# Patient Record
Sex: Male | Born: 1995 | Race: White | Hispanic: No | Marital: Single | Smoking: Never smoker
Health system: Southern US, Community
[De-identification: ages and names within clinical notes are randomized; demographics above are authoritative.]

## PROBLEM LIST (undated history)

## (undated) DIAGNOSIS — F319 Bipolar disorder, unspecified: Secondary | ICD-10-CM

## (undated) DIAGNOSIS — E669 Obesity, unspecified: Secondary | ICD-10-CM

## (undated) DIAGNOSIS — K219 Gastro-esophageal reflux disease without esophagitis: Secondary | ICD-10-CM

## (undated) DIAGNOSIS — F329 Major depressive disorder, single episode, unspecified: Secondary | ICD-10-CM

## (undated) DIAGNOSIS — H539 Unspecified visual disturbance: Secondary | ICD-10-CM

## (undated) DIAGNOSIS — F32A Depression, unspecified: Secondary | ICD-10-CM

## (undated) DIAGNOSIS — K449 Diaphragmatic hernia without obstruction or gangrene: Secondary | ICD-10-CM

## (undated) DIAGNOSIS — H9190 Unspecified hearing loss, unspecified ear: Secondary | ICD-10-CM

## (undated) DIAGNOSIS — F419 Anxiety disorder, unspecified: Secondary | ICD-10-CM

## (undated) DIAGNOSIS — I10 Essential (primary) hypertension: Secondary | ICD-10-CM

## (undated) HISTORY — PX: ADENOIDECTOMY: SUR15

## (undated) HISTORY — PX: TYMPANOSTOMY TUBE PLACEMENT: SHX32

---
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
## 2015-01-02 ENCOUNTER — Other Ambulatory Visit: Payer: Self-pay | Admitting: Family Medicine

## 2015-01-24 ENCOUNTER — Encounter: Payer: Self-pay | Admitting: Family Medicine

## 2015-01-24 ENCOUNTER — Ambulatory Visit (INDEPENDENT_AMBULATORY_CARE_PROVIDER_SITE_OTHER): Payer: Medicaid Other | Admitting: Family Medicine

## 2015-01-24 VITALS — BP 140/86 | HR 88 | Temp 97.8°F | Ht 67.0 in | Wt 393.8 lb

## 2015-01-24 DIAGNOSIS — A084 Viral intestinal infection, unspecified: Secondary | ICD-10-CM

## 2015-01-24 MED ORDER — ONDANSETRON 4 MG PO TBDP: 4.0000 mg | ORAL_TABLET | Freq: Three times a day (TID) | ORAL | Status: DC | PRN

## 2015-01-24 NOTE — Progress Notes (Signed)
   Subjective:    Patient ID: Maxwell Rose, male    DOB: 05-03-1996, 19 y.o.   MRN: [id number]  HPI: Pt presents to clinic for SDA, brought in with father, for 3 days of vomiting and diarrhea. His symptoms started with some reflux-type pain (sharp, in the middle of his upper abdomen / lower chest), which progressed into diarrhea and then nausea / vomiting over the next day. Today and yesterday his vomiting has been improved but he still has some loose stools, with a greenish color. He has no blood in his stool or emesis. No one else at home has been ill. He has no new exposures or medications. He takes omeprazole for reflux; he is also on Toprol and HCTZ for blood pressure. He has been able to keep his fluid and food intake up.  Review of Systems: As above. He had fever (tactile) two days ago and yesterday. Otherwise he has no SOB or difficulty breathing.     Objective:   Physical Exam BP 140/86 mmHg  Pulse 88  Temp(Src) 97.8 F (36.6 C) (Oral)  Ht 5\' 7"  (1.702 m)  Wt 393 lb 12.8 oz (178.627 kg)  BMI 61.66 kg/m2 Gen: morbidly obese young adult male in NAD HEENT: [place]/AT, EOMI, PERRLA, MMM, posterior oropharynx clear Cardio: heart sounds distant due to body habitus but no frank murmur noted Pulm: breath sounds also distant due to body habitus but generally clear, WOB unlabored Abd: morbidly obese greatly limiting exam, but generally soft, BS+  Mild tenderness to very deep palpation in epigastrium Ext: warm, well-perfused     Assessment & Plan:  19yo male with history suggestive of viral gastroenteritis, +/- component of exacerbated GERD-type symptoms - general well appearance with reported improvement in symptoms without treatment, so far - Rx for Zofran for symptomatic treatment of nausea - advised OTC medications for loose stools (Imodium; father mentioned milk of magnesia or Pepto-Bismol) - counseled on expected natural course of viral gastroenteritis and advised close f/u if  symptoms persist, worsen, or if new symptoms develop - otherwise, f/u with PCP Dr. Leveda AnnaHensel as needed.  Of note, CMA noted that father stated pt needed f/u to "review his medications," and that today "was supposed to be a follow-up, but then he got sick" - advised to f/u with PCP for long-term medication review, as needed  Bobbye Mortonhristopher M Koral Thaden, MD PGY-3, [facility] 01/24/2015, 11:06 AM

## 2015-01-24 NOTE — Progress Notes (Signed)
I was the preceptor for this visit. 

## 2015-01-24 NOTE — Patient Instructions (Signed)
Thank you for coming in, today!  I think you most likely have a viral gastroenteritis (stomach flu) causing your symptoms. You can take Zofran (ondansetron, prescription nausea medicine) as needed for nausea or vomiting. For diarrhea, you can try over-the-counter medications.  I would recommend Imodium (loperamide). You can also try milk of magnesia or Pepto-Bismol, etc. I would expect your symptoms to get better over the next 1-3 days, since it has already been 4. Come back to see Dr. Leveda AnnaHensel as you need, otherwise.  Please feel free to call with any questions or concerns at any time, at [phone]. --Dr. Casper HarrisonStreet

## 2015-04-21 ENCOUNTER — Ambulatory Visit (INDEPENDENT_AMBULATORY_CARE_PROVIDER_SITE_OTHER): Payer: Medicaid Other | Admitting: Family Medicine

## 2015-04-21 ENCOUNTER — Encounter: Payer: Self-pay | Admitting: Family Medicine

## 2015-04-21 DIAGNOSIS — I10 Essential (primary) hypertension: Secondary | ICD-10-CM

## 2015-04-21 DIAGNOSIS — F332 Major depressive disorder, recurrent severe without psychotic features: Secondary | ICD-10-CM | POA: Diagnosis not present

## 2015-04-21 DIAGNOSIS — K219 Gastro-esophageal reflux disease without esophagitis: Secondary | ICD-10-CM | POA: Diagnosis not present

## 2015-04-21 MED ORDER — METOPROLOL SUCCINATE ER 50 MG PO TB24: 50.0000 mg | ORAL_TABLET | Freq: Every day | ORAL | Status: DC

## 2015-04-21 MED ORDER — OMEPRAZOLE 40 MG PO CPDR: 40.0000 mg | DELAYED_RELEASE_CAPSULE | Freq: Every day | ORAL | Status: DC

## 2015-04-21 MED ORDER — HYDROCHLOROTHIAZIDE 25 MG PO TABS: 25.0000 mg | ORAL_TABLET | Freq: Every day | ORAL | Status: DC

## 2015-04-21 MED ORDER — FLUOXETINE HCL 40 MG PO CAPS: 40.0000 mg | ORAL_CAPSULE | Freq: Every day | ORAL | Status: DC

## 2015-04-21 NOTE — Patient Instructions (Signed)
I refilled your three medications. I also started a new antidepressant. Keep moving.  That is great I am VERY impressed with your 18 lb wt loss.

## 2015-04-21 NOTE — Progress Notes (Signed)
   Subjective:    Patient ID: Maxwell Rose, male    DOB: 1996/04/11, 19 y.o.   MRN: [id number]  HPI Maxwell Rose looks the best I have seen him in a couple of years.  He has lost 18 lbs since last visit.  He has done so by improving diet and increasing activity.  He is motivated by a new girlfriend.   Depression is moderate.  No HI or SI.  Not taking effexor because "it did not work."  Wants to try something else.     Review of Systems     Objective:   Physical ExamHigh BP noted.  Lungs clear Cardiac RRR without m or g         Assessment & Plan:

## 2015-04-21 NOTE — Assessment & Plan Note (Signed)
Given good wt loss and that he has not been taking HCTZ, no med change for now.  FU one month.

## 2015-04-21 NOTE — Assessment & Plan Note (Signed)
Prozac.  FU one month.

## 2015-04-21 NOTE — Assessment & Plan Note (Signed)
Nice progress.

## 2015-04-21 NOTE — Assessment & Plan Note (Signed)
Refill

## 2015-05-15 ENCOUNTER — Emergency Department (HOSPITAL_COMMUNITY)
Admission: EM | Admit: 2015-05-15 | Discharge: 2015-05-15 | Disposition: A | Discharge status: Home / Self Care | Payer: Medicaid Other | Attending: Emergency Medicine | Admitting: Emergency Medicine

## 2015-05-15 ENCOUNTER — Encounter (HOSPITAL_COMMUNITY): Payer: Self-pay | Admitting: Emergency Medicine

## 2015-05-15 DIAGNOSIS — I10 Essential (primary) hypertension: Secondary | ICD-10-CM | POA: Insufficient documentation

## 2015-05-15 DIAGNOSIS — Z880 Allergy status to penicillin: Secondary | ICD-10-CM | POA: Insufficient documentation

## 2015-05-15 DIAGNOSIS — R821 Myoglobinuria: Secondary | ICD-10-CM | POA: Diagnosis not present

## 2015-05-15 DIAGNOSIS — Z8669 Personal history of other diseases of the nervous system and sense organs: Secondary | ICD-10-CM | POA: Insufficient documentation

## 2015-05-15 DIAGNOSIS — K219 Gastro-esophageal reflux disease without esophagitis: Secondary | ICD-10-CM | POA: Diagnosis not present

## 2015-05-15 DIAGNOSIS — R319 Hematuria, unspecified: Secondary | ICD-10-CM | POA: Diagnosis present

## 2015-05-15 DIAGNOSIS — F329 Major depressive disorder, single episode, unspecified: Secondary | ICD-10-CM | POA: Insufficient documentation

## 2015-05-15 DIAGNOSIS — Z79899 Other long term (current) drug therapy: Secondary | ICD-10-CM | POA: Diagnosis not present

## 2015-05-15 LAB — URINE MICROSCOPIC-ADD ON

## 2015-05-15 LAB — I-STAT CHEM 8, ED
BUN: 19 mg/dL (ref 6–20)
Calcium, Ion: 1.1 mmol/L — ABNORMAL LOW (ref 1.12–1.23)
Chloride: 100 mmol/L — ABNORMAL LOW (ref 101–111)
Creatinine, Ser: 1.3 mg/dL — ABNORMAL HIGH (ref 0.61–1.24)
Glucose, Bld: 82 mg/dL (ref 65–99)
HCT: 45 % (ref 39.0–52.0)
HEMOGLOBIN: 15.3 g/dL (ref 13.0–17.0)
Potassium: 3.1 mmol/L — ABNORMAL LOW (ref 3.5–5.1)
Sodium: 141 mmol/L (ref 135–145)
TCO2: 24 mmol/L (ref 0–100)

## 2015-05-15 LAB — URINALYSIS, ROUTINE W REFLEX MICROSCOPIC
Bilirubin Urine: NEGATIVE
Glucose, UA: NEGATIVE mg/dL
KETONES UR: 15 mg/dL — AB
Leukocytes, UA: NEGATIVE
Nitrite: NEGATIVE
PROTEIN: NEGATIVE mg/dL
SPECIFIC GRAVITY, URINE: 1.025 (ref 1.005–1.030)
UROBILINOGEN UA: 0.2 mg/dL (ref 0.0–1.0)
pH: 6.5 (ref 5.0–8.0)

## 2015-05-15 LAB — CK: CK TOTAL: 494 U/L — AB (ref 49–397)

## 2015-05-15 MED ORDER — SODIUM CHLORIDE 0.9 % IV SOLN
1000.0000 mL | Freq: Once | INTRAVENOUS | Status: AC
  Administered 2015-05-15: 1000 mL via INTRAVENOUS

## 2015-05-15 MED ORDER — SODIUM CHLORIDE 0.9 % IV SOLN
1000.0000 mL | INTRAVENOUS | Status: DC
  Administered 2015-05-15: 1000 mL via INTRAVENOUS

## 2015-05-15 MED ORDER — POTASSIUM CHLORIDE CRYS ER 20 MEQ PO TBCR
40.0000 meq | EXTENDED_RELEASE_TABLET | Freq: Once | ORAL | Status: AC
  Administered 2015-05-15: 40 meq via ORAL
  Filled 2015-05-15: qty 2

## 2015-05-15 NOTE — ED Notes (Signed)
Mother stated, Unable to pee since 0500 this am.  Hurts when he does pee.

## 2015-05-15 NOTE — ED Provider Notes (Signed)
CSN: [id number]     Arrival date & time 05/15/15  1341 History   First MD Initiated Contact with Patient 05/15/15 1731     Chief Complaint  Patient presents with  . Urinary Retention     (Consider location/radiation/quality/duration/timing/severity/associated sxs/prior Treatment) HPI   19 year old morbidly obese male, accompanied by mom to the ER for evaluation of blood in urine.  Patient report he has been playing outside in the heat for the past few days. This morning he woke up and when he went to urinate he notice blood in his urine. Blood is noted throughout the urine stream. This concerns him as he has not had this problem before. He denies having a significant body aches, back pain, dysuria, urinary retention, or rash. No change in medication. No history of kidney stones. He only urinated twice today. Denies any recent URI symptoms for strep infection.  Past Medical History  Diagnosis Date  . Depression   . GERD (gastroesophageal reflux disease)   . Hypertension   . Bipolar 1 disorder   . Hiatal hernia   . Vision abnormalities     near sited  . Obesity   . Hiatal hernia   . Hearing difficulty    Past Surgical History  Procedure Laterality Date  . Tympanostomy tube placement      sev  . Adenoidectomy    . Tooth extraction N/A 01/31/2014    Procedure: EXTRACTION 3rd MOLARS;  Surgeon: Georgia Lopes, DDS;  [facility]: [facility];  Service: Oral Surgery;  Laterality: N/A;   Family History  Problem Relation Age of Onset  . Depression Mother   . Mental illness Mother   . Varicose Veins Mother   . Asthma Sister   . Learning disabilities Sister   . Arthritis Maternal Grandmother   . Cancer Maternal Grandmother   . COPD Maternal Grandmother   . Depression Maternal Grandmother   . Heart disease Maternal Grandmother   . Hyperlipidemia Maternal Grandmother   . Hypertension Maternal Grandmother   . Arthritis Maternal Grandfather   . COPD Maternal Grandfather   . Diabetes Maternal  Grandfather   . Hyperlipidemia Maternal Grandfather   . Hypertension Maternal Grandfather   . Arthritis Paternal Grandmother   . Arthritis Paternal Grandfather    History  Substance Use Topics  . Smoking status: Never Smoker   . Smokeless tobacco: Never Used  . Alcohol Use: No    Review of Systems  Constitutional: Negative for fever.  Gastrointestinal: Negative for abdominal pain.  Genitourinary: Negative for flank pain.  Skin: Negative for rash and wound.  Neurological: Negative for headaches.  All other systems reviewed and are negative.     Allergies  Penicillins  Home Medications   Prior to Admission medications   Medication Sig Start Date End Date Taking? Authorizing Provider  FLUoxetine (PROZAC) 40 MG capsule Take 1 capsule (40 mg total) by mouth daily. 04/21/15   Moses Manners, MD  hydrochlorothiazide (HYDRODIURIL) 25 MG tablet Take 1 tablet (25 mg total) by mouth daily. 04/21/15   Moses Manners, MD  metoprolol succinate (TOPROL-XL) 50 MG 24 hr tablet Take 1 tablet (50 mg total) by mouth daily. Take with or immediately following a meal. 04/21/15   Moses Manners, MD  omeprazole (PRILOSEC) 40 MG capsule Take 1 capsule (40 mg total) by mouth daily. 04/21/15   Moses Manners, MD   BP 105/62 mmHg  Pulse 77  Temp(Src) 98.3 F (36.8 C) (Oral)  Resp 17  Ht 5\' 7"  (1.702 m)  Wt 373 lb 3 oz (169.277 kg)  BMI 58.44 kg/m2  SpO2 99% Physical Exam  Constitutional: He appears well-developed and well-nourished. No distress.  Mobility obese Caucasian male appears to be in no acute distress, ambulate without difficulty.  HENT:  Head: Atraumatic.  Mouth/Throat: Oropharynx is clear and moist.  Eyes: Conjunctivae are normal.  Neck: Neck supple.  Cardiovascular: Normal rate and regular rhythm.   Pulmonary/Chest: Effort normal and breath sounds normal.  Abdominal: Soft. Bowel sounds are normal. He exhibits no distension. There is no tenderness.  No CVA tenderness.   Genitourinary:  Chaperone present exam. Examination is difficult due to large body habitus. Testicle nontender on palpation, normal scrotum without lesions or rash. Uncircumcised penis with normal shaft. No mass. Normal gland, no rash.  No penile discharge.  No phimosis or paraphimosis.  No inguinal hernia.  Peroneal soft.    Neurological: He is alert.  Skin: No rash noted.  Psychiatric: He has a normal mood and affect.  Nursing note and vitals reviewed.   ED Course  Procedures (including critical care time)  Patient presents for evaluation of new onset of hematuria. Evidence of moderate hemoglobin urine dipstick with 0-2 RBC to suggest myoglobin. This may be related to possible rhabdo from playing outside for the past 4 days. He has no CVA tenderness to suggest kidney stones. No urinary retention. Blood pressure is soft with systolic in the 90s. Patient however is in no acute discomfort. Will give IV fluid, check basic labs, and will monitor closely.  10:48 PM His creatinine is 1.3. Mildly elevated CK total of 494. Patient has received a total 4 L of normal saline. He is also able to drink fluid by mouth without difficulty. Evidence of mild hypokalemia with a potassium of 3.1, supplementation given.  Patient now able to urinate, states he felt much better. 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 He will follow-up with his primary care Dr. for recheck of his kidney function. Recommend hydration at home. Return precautions discussed.  Care discussed with Dr. Romeo Apple.  Labs Review Labs Reviewed  URINALYSIS, ROUTINE W REFLEX MICROSCOPIC (NOT AT [facility]) - Abnormal; Notable for the following:    Hgb urine dipstick MODERATE (*)    Ketones, ur 15 (*)    All other components within normal limits  URINE MICROSCOPIC-ADD ON - Abnormal; Notable for the following:    Squamous Epithelial / LPF FEW (*)    All other components within normal limits  CK - Abnormal;  Notable for the following:    Total CK 494 (*)    All other components within normal limits  I-STAT CHEM 8, ED - Abnormal; Notable for the following:    Potassium 3.1 (*)    Chloride 100 (*)    Creatinine, Ser 1.30 (*)    Calcium, Ion 1.10 (*)    All other components within normal limits    Imaging Review No results found.   EKG Interpretation None      MDM   Final diagnoses:  Exercise myoglobinuria    BP 110/64 mmHg  Pulse 74  Temp(Src) 98.3 F (36.8 C) (Oral)  Resp 20  Ht 5\' 7"  (1.702 m)  Wt 373 lb 3 oz (169.277 kg)  BMI 58.44 kg/m2  SpO2 100%      Fayrene Helper, PA-C 05/15/15 2250  [name], MD 05/15/15 2356

## 2015-05-15 NOTE — Discharge Instructions (Signed)
Please drink plenty of fluids, follow-up with your doctor in the next few days to have a recheck of your kidney function. Return to ER if your condition worsen or if you have any other concern.  Rhabdomyolysis Rhabdomyolysis is the breakdown of muscle fibers due to injury. The injury may come from physical damage to the muscle like an injury but other causes are:  High fever (hyperthermia).  Seizures (convulsions).  Low phosphate levels.  Diseases of metabolism.  Heatstroke.  Drug toxicity.  Over exertion.  Alcoholism.  Muscle is cut off from oxygen (anoxia).  The squeezing of nerves and blood vessels (compartment syndrome). Some drugs which may cause the breakdown of muscle are:  Antibiotics.  Statins.  Alcohol.  Animal toxins. Myoglobin is a substance which helps muscle use oxygen. When the muscle is damaged, the myoglobin is released into the bloodstream. It is filtered out of the bloodstream by the kidneys. Myoglobin may block up the kidneys. This may cause damage, such as kidney failure. It also breaks down into other damaging toxic parts, which also cause kidney failure.  SYMPTOMS   Dark, red, or tea colored urine.  Weakness of affected muscles.  Weight gain from water retention.  Joint aches and pains.  Irregular heart from high potassium in the blood.  Muscle tenderness or aching.  Generalized weakness.  Seizures.  Feeling tired (fatigue). DIAGNOSIS  Your caregiver may find muscle tenderness on exam and suspect the problem. Urine tests and blood work can confirm the problem. TREATMENT   Early and aggressive treatment with large amounts of fluids may help prevent kidney failure.  Water producing medicine (diuretic) may be used to help flush the kidneys.  High potassium and calcium problems (electrolyte) in your blood may need treatment. HOME CARE INSTRUCTIONS  This problem is usually cared for in a hospital. If you are allowed to go home and  require dialysis, make sure you keep all appointments for lab work and dialysis. Not doing so could result in death. Document Released: 10/03/04 Document Revised: 12/30/2011 Document Reviewed: 04/03/2009 [facility] Patient Information 2015 [place], [place]. This information is not intended to replace advice given to you by your health care provider. Make sure you discuss any questions you have with your health care provider.

## 2015-05-16 ENCOUNTER — Telehealth: Payer: Self-pay | Admitting: Family Medicine

## 2015-05-16 NOTE — Telephone Encounter (Signed)
The pt's mother is calling and she states that the pt was seen in the ED yesterday and was told that he had very low blood pressure, low kidney function, and blood work would need to be done. She has an appt with Dr. Leveda Anna next Wednesday (8/3) at 1:45pm and she would like to bring the pt in with her to fu for these conditions to be seen by Dr. Leveda Anna. PCP has openings on the following Fri however that wouldn't be a good day for them as a family member is having surgery that day. I can double-book him with his mother at the 1:45pm slot if given permission. Thank you, Dorothey Baseman, ASA

## 2015-05-24 ENCOUNTER — Ambulatory Visit (INDEPENDENT_AMBULATORY_CARE_PROVIDER_SITE_OTHER): Payer: Medicaid Other | Admitting: Family Medicine

## 2015-05-24 ENCOUNTER — Encounter: Payer: Self-pay | Admitting: Family Medicine

## 2015-05-24 VITALS — BP 126/72 | HR 88 | Temp 97.9°F | Ht 67.0 in | Wt 370.8 lb

## 2015-05-24 DIAGNOSIS — M6282 Rhabdomyolysis: Secondary | ICD-10-CM | POA: Diagnosis not present

## 2015-05-24 DIAGNOSIS — K7581 Nonalcoholic steatohepatitis (NASH): Secondary | ICD-10-CM

## 2015-05-24 LAB — BASIC METABOLIC PANEL
BUN: 13 mg/dL (ref 7–20)
CHLORIDE: 104 mmol/L (ref 98–110)
CO2: 23 mmol/L (ref 20–31)
Calcium: 8.9 mg/dL (ref 8.9–10.4)
Creat: 1.17 mg/dL (ref 0.60–1.26)
Glucose, Bld: 87 mg/dL (ref 65–99)
Potassium: 3.5 mmol/L — ABNORMAL LOW (ref 3.8–5.1)
SODIUM: 143 mmol/L (ref 135–146)

## 2015-05-24 LAB — CK: Total CK: 1161 U/L — ABNORMAL HIGH (ref 7–232)

## 2015-05-24 NOTE — Patient Instructions (Signed)
Keep up the good work with diet and exercise.  I will call with the lab results. See me in 3-6 months.

## 2015-05-25 NOTE — Progress Notes (Signed)
   Subjective:    Patient ID: Maxwell Rose, male    DOB: 1996/01/01, 19 y.o.   MRN: [id number]  HPI  Seen in ER with muscle pain weakness.  Found to be a bit dehydrated and have mild rhabdo.  Here for recheck.  Feels fine.  Drinking lots of fluids.  Normal urine output.   He has been exercising heavily.  Great 23 lb weight loss over past few months.  Exercise has been particularly heavy recently with most outside in the heat.  Rhabdo was felt to be a combo of heat, dehydration and overexertion.      Review of Systems On no meds which have been implicated with rhabdo or myositis.  No reported trauma     Objective:   Physical Exam No muscle tenderness or weakness.  Appears well hydrated.       Assessment & Plan:

## 2015-05-25 NOTE — Assessment & Plan Note (Signed)
MIld LFT elevations and fatty liver on ultrasound.  Thought secondary to morbid obesity.

## 2015-05-25 NOTE — Assessment & Plan Note (Signed)
Check labs.  I am surprised by the increased CK.  Called and informed to continue drinking lots of water.  Decrease exercise.  Recheck labs next week.

## 2015-05-29 ENCOUNTER — Encounter (HOSPITAL_COMMUNITY): Payer: Self-pay | Admitting: Neurology

## 2015-05-29 ENCOUNTER — Telehealth: Payer: Self-pay | Admitting: Family Medicine

## 2015-05-29 ENCOUNTER — Emergency Department (HOSPITAL_COMMUNITY)
Admission: EM | Admit: 2015-05-29 | Discharge: 2015-05-29 | Disposition: A | Discharge status: Home / Self Care | Payer: Medicaid Other | Attending: Emergency Medicine | Admitting: Emergency Medicine

## 2015-05-29 DIAGNOSIS — F319 Bipolar disorder, unspecified: Secondary | ICD-10-CM | POA: Insufficient documentation

## 2015-05-29 DIAGNOSIS — G8929 Other chronic pain: Secondary | ICD-10-CM | POA: Insufficient documentation

## 2015-05-29 DIAGNOSIS — M545 Low back pain: Secondary | ICD-10-CM | POA: Diagnosis present

## 2015-05-29 DIAGNOSIS — K219 Gastro-esophageal reflux disease without esophagitis: Secondary | ICD-10-CM | POA: Diagnosis not present

## 2015-05-29 DIAGNOSIS — Z79899 Other long term (current) drug therapy: Secondary | ICD-10-CM | POA: Diagnosis not present

## 2015-05-29 DIAGNOSIS — H9190 Unspecified hearing loss, unspecified ear: Secondary | ICD-10-CM | POA: Diagnosis not present

## 2015-05-29 DIAGNOSIS — M549 Dorsalgia, unspecified: Secondary | ICD-10-CM

## 2015-05-29 DIAGNOSIS — Z880 Allergy status to penicillin: Secondary | ICD-10-CM | POA: Insufficient documentation

## 2015-05-29 LAB — BASIC METABOLIC PANEL
Anion gap: 13 (ref 5–15)
BUN: 10 mg/dL (ref 6–20)
CO2: 24 mmol/L (ref 22–32)
CREATININE: 1.12 mg/dL (ref 0.61–1.24)
Calcium: 9.2 mg/dL (ref 8.9–10.3)
Chloride: 102 mmol/L (ref 101–111)
GFR calc Af Amer: >60 mL/min (ref >60)
Glucose, Bld: 96 mg/dL (ref 65–99)
POTASSIUM: 3.7 mmol/L (ref 3.5–5.1)
SODIUM: 139 mmol/L (ref 135–145)

## 2015-05-29 LAB — CBC WITH DIFFERENTIAL/PLATELET
Basophils Absolute: 0 10*3/uL (ref 0.0–0.1)
Basophils Relative: 0 % (ref 0–1)
EOS ABS: 0.1 10*3/uL (ref 0.0–0.7)
EOS PCT: 1 % (ref 0–5)
HCT: 43.1 % (ref 39.0–52.0)
Hemoglobin: 14.8 g/dL (ref 13.0–17.0)
Lymphocytes Relative: 43 % (ref 12–46)
Lymphs Abs: 2.8 10*3/uL (ref 0.7–4.0)
MCH: 27.8 pg (ref 26.0–34.0)
MCHC: 34.3 g/dL (ref 30.0–36.0)
MCV: 80.9 fL (ref 78.0–100.0)
Monocytes Absolute: 0.4 10*3/uL (ref 0.1–1.0)
Monocytes Relative: 7 % (ref 3–12)
Neutro Abs: 3.2 10*3/uL (ref 1.7–7.7)
Neutrophils Relative %: 49 % (ref 43–77)
Platelets: 269 10*3/uL (ref 150–400)
RBC: 5.33 MIL/uL (ref 4.22–5.81)
RDW: 13.3 % (ref 11.5–15.5)
WBC: 6.5 10*3/uL (ref 4.0–10.5)

## 2015-05-29 LAB — URINALYSIS, ROUTINE W REFLEX MICROSCOPIC
Bilirubin Urine: NEGATIVE
Glucose, UA: NEGATIVE mg/dL
Hgb urine dipstick: NEGATIVE
Ketones, ur: NEGATIVE mg/dL
LEUKOCYTES UA: NEGATIVE
Nitrite: NEGATIVE
Protein, ur: NEGATIVE mg/dL
Specific Gravity, Urine: 1.027 (ref 1.005–1.030)
Urobilinogen, UA: 1 mg/dL (ref 0.0–1.0)
pH: 6.5 (ref 5.0–8.0)

## 2015-05-29 LAB — CK: Total CK: 327 U/L (ref 49–397)

## 2015-05-29 MED ORDER — ACETAMINOPHEN 500 MG PO TABS
1000.0000 mg | ORAL_TABLET | Freq: Once | ORAL | Status: AC
  Administered 2015-05-29: 1000 mg via ORAL
  Filled 2015-05-29: qty 2

## 2015-05-29 NOTE — Discharge Instructions (Signed)
Take Tylenol as directed for pain. Contact Dr.Hensel if pain not controlled with Tylenol. Return if your condition worsens for any reason

## 2015-05-29 NOTE — ED Notes (Signed)
Pt here with lower back pain reports hasn't urinated since this morning and hx of poor kidney function and muscle breakdown. Is seen by family practice. Pt is a x 4.

## 2015-05-29 NOTE — Telephone Encounter (Signed)
Mother called because her son went to the ER a few days ago and also had a Hospital f/u . Her son is having a muscle break down. She said that he has not used the bathroom only one time today and it was orange. She would like to speak to a nurse right away. jw

## 2015-05-29 NOTE — ED Provider Notes (Addendum)
CSN: [id number]     Arrival date & time 05/29/15  1704 History   First MD Initiated Contact with Patient 05/29/15 2147     Chief Complaint  Patient presents with  . Back Pain     (Consider location/radiation/quality/duration/timing/severity/associated sxs/prior Treatment) HPI Complains of low non-rating back pain onset approximately one week ago. Patient has been getting similar episodes of back pain for several years Patient urinated 5 AM today and last urinated in the emergency department. He has no feeling of post void residual. No urgency. No urinary symptoms. No loss of bladder bowel control no fever. He was seen here last week determined to have rhabdomyolysis. He was scheduled to is scheduled to have repeat lab work at the [facility] in 2 days. Pain is nonradiating. Worse with changing positions improved with remaining still. No other associated symptoms Past Medical History  Diagnosis Date  . Depression   . GERD (gastroesophageal reflux disease)   . Hypertension   . Bipolar 1 disorder   . Hiatal hernia   . Vision abnormalities     near sited  . Obesity   . Hiatal hernia   . Hearing difficulty    Past Surgical History  Procedure Laterality Date  . Tympanostomy tube placement      sev  . Adenoidectomy    . Tooth extraction N/A 01/31/2014    Procedure: EXTRACTION 3rd MOLARS;  Surgeon: Georgia Lopes, DDS;  Location: [facility] OR;  Service: Oral Surgery;  Laterality: N/A;   Family History  Problem Relation Age of Onset  . Depression Mother   . Mental illness Mother   . Varicose Veins Mother   . Asthma Sister   . Learning disabilities Sister   . Arthritis Maternal Grandmother   . Cancer Maternal Grandmother   . COPD Maternal Grandmother   . Depression Maternal Grandmother   . Heart disease Maternal Grandmother   . Hyperlipidemia Maternal Grandmother   . Hypertension Maternal Grandmother   . Arthritis Maternal Grandfather   . COPD Maternal Grandfather   .  Diabetes Maternal Grandfather   . Hyperlipidemia Maternal Grandfather   . Hypertension Maternal Grandfather   . Arthritis Paternal Grandmother   . Arthritis Paternal Grandfather    History  Substance Use Topics  . Smoking status: Never Smoker   . Smokeless tobacco: Never Used  . Alcohol Use: No    Review of Systems  Constitutional: Negative.   HENT: Negative.   Respiratory: Negative.   Cardiovascular: Negative.   Gastrointestinal: Negative.   Musculoskeletal: Positive for back pain.  Skin: Negative.   Neurological: Negative.   Psychiatric/Behavioral: Negative.   All other systems reviewed and are negative.     Allergies  Penicillins  Home Medications   Prior to Admission medications   Medication Sig Start Date End Date Taking? Authorizing Provider  FLUoxetine (PROZAC) 40 MG capsule Take 1 capsule (40 mg total) by mouth daily. 04/21/15   Moses Manners, MD  hydrochlorothiazide (HYDRODIURIL) 25 MG tablet Take 1 tablet (25 mg total) by mouth daily. 04/21/15   Moses Manners, MD  metoprolol succinate (TOPROL-XL) 50 MG 24 hr tablet Take 1 tablet (50 mg total) by mouth daily. Take with or immediately following a meal. 04/21/15   Moses Manners, MD  omeprazole (PRILOSEC) 40 MG capsule Take 1 capsule (40 mg total) by mouth daily. 04/21/15   Moses Manners, MD   BP 132/65 mmHg  Pulse 73  Temp(Src) 98.3 F (36.8 C) (Oral)  Resp 20  Ht  (1.702 m)  Wt 370 lb (167.831 kg)  BMI 57.94 kg/m2  SpO2 100% Physical Exam  Constitutional: He appears well-developed and well-nourished.  HENT:  Head: Normocephalic and atraumatic.  Eyes: Conjunctivae are normal. Pupils are equal, round, and reactive to light.  Neck: Neck supple. No tracheal deviation present. No thyromegaly present.  Cardiovascular: Normal rate and regular rhythm.   No murmur heard. Pulmonary/Chest: Effort normal and breath sounds normal.  Abdominal: Soft. Bowel sounds are normal. He exhibits no distension. There  is no tenderness.  Morbidly obese  Musculoskeletal: Normal range of motion. He exhibits no edema or tenderness.  Entire spine nontender. He has pain at lumbar area when he sits up from a supine position.  Neurological: He is alert. Coordination normal.  Gait normal DTR symmetric bilaterally knee jerk biceps was ordered bilaterally  Skin: Skin is warm and dry. No rash noted.  Psychiatric: He has a normal mood and affect.  Nursing note and vitals reviewed.   ED Course  Procedures (including critical care time) Labs Review Labs Reviewed  URINALYSIS, ROUTINE W REFLEX MICROSCOPIC (NOT AT [facility]) - Abnormal; Notable for the following:    APPearance HAZY (*)    All other components within normal limits  CBC WITH DIFFERENTIAL/PLATELET  BASIC METABOLIC PANEL  CK    Imaging Review No results found.   EKG Interpretation None      Results for orders placed or performed during the hospital encounter of 05/29/15  CBC with Differential  Result Value Ref Range   WBC 6.5 4.0 - 10.5 K/uL   RBC 5.33 4.22 - 5.81 MIL/uL   Hemoglobin 14.8 13.0 - 17.0 g/dL   HCT 13.0 86.5 - 78.4 %   MCV 80.9 78.0 - 100.0 fL   MCH 27.8 26.0 - 34.0 pg   MCHC 34.3 30.0 - 36.0 g/dL   RDW 69.6 29.5 - 28.4 %   Platelets 269 150 - 400 K/uL   Neutrophils Relative % 49 43 - 77 %   Neutro Abs 3.2 1.7 - 7.7 K/uL   Lymphocytes Relative 43 12 - 46 %   Lymphs Abs 2.8 0.7 - 4.0 K/uL   Monocytes Relative 7 3 - 12 %   Monocytes Absolute 0.4 0.1 - 1.0 K/uL   Eosinophils Relative 1 0 - 5 %   Eosinophils Absolute 0.1 0.0 - 0.7 K/uL   Basophils Relative 0 0 - 1 %   Basophils Absolute 0.0 0.0 - 0.1 K/uL  Basic metabolic panel  Result Value Ref Range   Sodium 139 135 - 145 mmol/L   Potassium 3.7 3.5 - 5.1 mmol/L   Chloride 102 101 - 111 mmol/L   CO2 24 22 - 32 mmol/L   Glucose, Bld 96 65 - 99 mg/dL   BUN 10 6 - 20 mg/dL   Creatinine, Ser 1.32 0.61 - 1.24 mg/dL   Calcium 9.2 8.9 - 44.0 mg/dL   GFR calc non Af Amer >60  >60 mL/min   GFR calc Af Amer >60 >60 mL/min   Anion gap 13 5 - 15  Urinalysis, Routine w reflex microscopic (not at [facility])  Result Value Ref Range   Color, Urine YELLOW YELLOW   APPearance HAZY (A) CLEAR   Specific Gravity, Urine 1.027 1.005 - 1.030   pH 6.5 5.0 - 8.0   Glucose, UA NEGATIVE NEGATIVE mg/dL   Hgb urine dipstick NEGATIVE NEGATIVE   Bilirubin Urine NEGATIVE NEGATIVE   Ketones, ur NEGATIVE NEGATIVE  mg/dL   Protein, ur NEGATIVE NEGATIVE mg/dL   Urobilinogen, UA 1.0 0.0 - 1.0 mg/dL   Nitrite NEGATIVE NEGATIVE   Leukocytes, UA NEGATIVE NEGATIVE  CK  Result Value Ref Range   Total CK 327 49 - 397 U/L   No results found.  MDM  Rhabdomyolysis from previous visit is resolving. We'll function normal. Urinalysis normal. Pain felt to be musculoskeletal in etiology. Plan Tylenol for pain. Follow-up with Dr. Leveda Anna as needed Final diagnoses:  None   diagnosis low back pain      Doug Sou, MD 05/29/15 2225  Doug Sou, MD 05/29/15 2226

## 2015-05-29 NOTE — ED Notes (Signed)
MD at the bedside  

## 2015-05-30 NOTE — Telephone Encounter (Signed)
Mom stated she took patient to ED last night. Clovis Pu, RN

## 2015-05-31 ENCOUNTER — Other Ambulatory Visit: Payer: Medicaid Other

## 2015-06-13 ENCOUNTER — Telehealth: Payer: Self-pay | Admitting: Family Medicine

## 2015-06-13 ENCOUNTER — Encounter: Payer: Self-pay | Admitting: Family Medicine

## 2015-06-13 ENCOUNTER — Ambulatory Visit (INDEPENDENT_AMBULATORY_CARE_PROVIDER_SITE_OTHER): Payer: Medicaid Other | Admitting: Family Medicine

## 2015-06-13 VITALS — BP 150/70 | HR 78 | Temp 98.4°F | Wt 369.0 lb

## 2015-06-13 DIAGNOSIS — H60399 Other infective otitis externa, unspecified ear: Secondary | ICD-10-CM | POA: Insufficient documentation

## 2015-06-13 DIAGNOSIS — H60391 Other infective otitis externa, right ear: Secondary | ICD-10-CM | POA: Diagnosis present

## 2015-06-13 MED ORDER — CIPROFLOXACIN-DEXAMETHASONE 0.3-0.1 % OT SUSP: 4.0000 [drp] | Freq: Two times a day (BID) | OTIC | Status: DC

## 2015-06-13 NOTE — Telephone Encounter (Signed)
Even though patient has been seen by Dr. Ezzard Standing in the past, Dr. Allene Pyo office no longer accepts Medicaid and will not see patient unless he pays out of pocket. Called and spoke with patient and he would like his mother to make this decision. Asked patient to have mother call me back so we could discuss. Will await callback.

## 2015-06-13 NOTE — Patient Instructions (Signed)
Stop using Qtips in your ears. I have prescribed you Ciprodex- place 4 drops in the right eat twice daily for 7 days. I have placed a referral back to ENT.  Otitis Externa Otitis externa is a bacterial or fungal infection of the outer ear canal. This is the area from the eardrum to the outside of the ear. Otitis externa is sometimes called "swimmer's ear." CAUSES  Possible causes of infection include:  Swimming in dirty water.  Moisture remaining in the ear after swimming or bathing.  Mild injury (trauma) to the ear.  Objects stuck in the ear (foreign body).  Cuts or scrapes (abrasions) on the outside of the ear. SIGNS AND SYMPTOMS  The first symptom of infection is often itching in the ear canal. Later signs and symptoms may include swelling and redness of the ear canal, ear pain, and yellowish-white fluid (pus) coming from the ear. The ear pain may be worse when pulling on the earlobe. DIAGNOSIS  Your health care provider will perform a physical exam. A sample of fluid may be taken from the ear and examined for bacteria or fungi. TREATMENT  Antibiotic ear drops are often given for 10 to 14 days. Treatment may also include pain medicine or corticosteroids to reduce itching and swelling. HOME CARE INSTRUCTIONS   Apply antibiotic ear drops to the ear canal as prescribed by your health care provider.  Take medicines only as directed by your health care provider.  If you have diabetes, follow any additional treatment instructions from your health care provider.  Keep all follow-up visits as directed by your health care provider. PREVENTION   Keep your ear dry. Use the corner of a towel to absorb water out of the ear canal after swimming or bathing.  Avoid scratching or putting objects inside your ear. This can damage the ear canal or remove the protective wax that lines the canal. This makes it easier for bacteria and fungi to grow.  Avoid swimming in lakes, polluted water, or  poorly chlorinated pools.  You may use ear drops made of rubbing alcohol and vinegar after swimming. Combine equal parts of white vinegar and alcohol in a bottle. Put 3 or 4 drops into each ear after swimming. SEEK MEDICAL CARE IF:   You have a fever.  Your ear is still red, swollen, painful, or draining pus after 3 days.  Your redness, swelling, or pain gets worse.  You have a severe headache.  You have redness, swelling, pain, or tenderness in the area behind your ear. MAKE SURE YOU:   Understand these instructions.  Will watch your condition.  Will get help right away if you are not doing well or get worse. Document Released: 10/07/2005 Document Revised: 02/21/2014 Document Reviewed: 10/24/2011 [facility] Patient Information 2015 [place], [place]. This information is not intended to replace advice given to you by your health care provider. Make sure you discuss any questions you have with your health care provider.

## 2015-06-13 NOTE — Progress Notes (Signed)
Patient ID: KETRICK MATNEY, male   DOB: Feb 01, 1996, 19 y.o.   MRN: [id number]    Subjective: CC: ear pain HPI: Patient is a 19 y.o. male with a past medical history of eustachian tube dysfunction and chronic ear infections presenting to clinic today for R ear pain.  Patient presenting with R ear pain x 4 days.  Patient had subjective low grade fever yesterday, however didn't take temperature.  He couldn't get a Qtip in his ear to clean it out after putting peroxide in it (felt it was swollen).  Mother has Ciprodex from previous ear infections (expired), he has used it for 2 days twice a day- possibly a little a better.  Mild cough at night and sometimes in the AM.  Endorses a runny nose (occurs each morning that clears up after lunch). No sneezing, post-nasal drip, or sore throat. No longer taking loratadine and Flonase because it didn't help.  TMJ pain on the R, pain with mastication. Patient chewing on L side.  Patient endorses problems with being unable to "pop his ears." No SOB or DOE. Patient has had 3 ET surgeries in the past with Dr. Ezzard Standing.   Social History: never smoker   ROS: All other systems reviewed and are negative.  Past Medical History Patient Active Problem List   Diagnosis Date Noted  . Otitis, externa, infective 06/13/2015  . Rhabdomyolysis 05/24/2015  . Ankle pain 11/08/2014  . Chronic toe pain, right foot 10/26/2014  . Enuresis, nocturnal only 10/26/2014  . Insomnia 02/17/2014  . Eustachian tube dysfunction 01/19/2014  . Morbid obesity with body mass index of 50 or higher 06/29/2012  . Hypertension 12/06/2010  . SLEEP APNEA 10/26/2010  . 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 Severe major depression without psychotic features 07/30/2010  . GERD 10/03/2009  . Morbid obesity 05/31/2009  . NASH (nonalcoholic steatohepatitis) 05/31/2009    Medications- reviewed and updated Current Outpatient Prescriptions  Medication Sig Dispense  Refill  . ciprofloxacin-dexamethasone (CIPRODEX) otic suspension Place 4 drops into the right ear 2 (two) times daily. 7.5 mL 0  . FLUoxetine (PROZAC) 40 MG capsule Take 1 capsule (40 mg total) by mouth daily. 90 capsule 3  . hydrochlorothiazide (HYDRODIURIL) 25 MG tablet Take 1 tablet (25 mg total) by mouth daily. 90 tablet 3  . metoprolol succinate (TOPROL-XL) 50 MG 24 hr tablet Take 1 tablet (50 mg total) by mouth daily. Take with or immediately following a meal. 90 tablet 3  . omeprazole (PRILOSEC) 40 MG capsule Take 1 capsule (40 mg total) by mouth daily. 90 capsule 3   No current facility-administered medications for this visit.    Objective: Office vital signs reviewed. BP 150/70 mmHg  Pulse 78  Temp(Src) 98.4 F (36.9 C) (Oral)  Wt 369 lb (167.377 kg)   Physical Examination:  General: Awake, alert, well- nourished, NAD.  ENMT:  TM on R grey, thickened, non-bulging without effusion. Pain with movement of tragus, some pain with movement of the pina. L TM clear, non-bulging.   Nasal turbinates moist. MMM, Oropharynx clear without erythema or tonsillar exudate/hypertrophy, did not cobblestoning of oropharynx.  Eyes: Conjunctiva non-injected. PERRL.  Cardio: RRR, no m/r/g noted.  Pulm: No increased WOB.  CTAB, without wheezes, rhonchi or crackles noted.   Assessment/Plan: Otitis, externa, infective Patient presenting with signs/symptoms of otitis externa. No TM perforation noted. Possibly some subjective fevers but otherwise no systemic symptoms. No evidence of otitis media or cholesteatoma.  - Rx for ciprodex 4 drops  into the R ear for 7 days.  - referral back to ENT given h/o recurrent infections per mother/patient  - RTC precautions discussed.     Orders Placed This Encounter  Procedures  . Ambulatory referral to ENT    Referral Priority:  Routine    Referral Type:  Consultation    Referral Reason:  Specialty Services Required    Requested Specialty:  Otolaryngology     Number of Visits Requested:  1    Meds ordered this encounter  Medications  . ciprofloxacin-dexamethasone (CIPRODEX) otic suspension    Sig: Place 4 drops into the right ear 2 (two) times daily.    Dispense:  7.5 mL    Refill:  0    Joanna Puff PGY-2, [facility]

## 2015-06-13 NOTE — Assessment & Plan Note (Signed)
Patient presenting with signs/symptoms of otitis externa. No TM perforation noted. Possibly some subjective fevers but otherwise no systemic symptoms. No evidence of otitis media or cholesteatoma.  - Rx for ciprodex 4 drops into the R ear for 7 days.  - referral back to ENT given h/o recurrent infections per mother/patient  - RTC precautions discussed.

## 2015-06-14 ENCOUNTER — Encounter: Payer: Self-pay | Admitting: Family Medicine

## 2015-06-14 ENCOUNTER — Telehealth: Payer: Self-pay | Admitting: Family Medicine

## 2015-06-14 ENCOUNTER — Ambulatory Visit (INDEPENDENT_AMBULATORY_CARE_PROVIDER_SITE_OTHER): Payer: Medicaid Other | Admitting: Family Medicine

## 2015-06-14 VITALS — BP 118/88 | HR 92 | Temp 98.4°F | Ht 67.0 in | Wt 366.7 lb

## 2015-06-14 DIAGNOSIS — G4730 Sleep apnea, unspecified: Secondary | ICD-10-CM

## 2015-06-14 DIAGNOSIS — R400 Somnolence: Secondary | ICD-10-CM | POA: Diagnosis present

## 2015-06-14 NOTE — Telephone Encounter (Signed)
Received a call on the after hours line from patient's mother, who voices concerns over shakiness that she observed during patient sleeping.  She describes the episodes as "convulsions".  She reports that she is unable to rouse patient out of spells.  Episodes last about 5 minutes.  No post ictal period is appreciated per mother.  She reports that this has occurred a few times during sleep over the last few days.  She denies urinary or fecal incontinence.  She also comments that BP has been elevated to 150-160's/90-100's over the last day or so.  She is concerned that this is too high.  Patient denies headache, SOB, CP, N/V, difficulty urinating, weakness, neurologic changes.  Patient missed his dose of Toprol yesterday and just took his dose of HCTZ this am.  Mother reports that she checked patient's BP 5 times consecutively in the same arm.  Discussed with mother that BP is likely elevated 2/2 noncompliance with BP meds.  Instructed her to have him take his medications and check BP in about 30 minutes in the opposite arm.  If patient develops any of the red flag symptoms we discussed, she should bring him to ED.  Otherwise, she should f/u with PCP re: BP meds.  With regards to convulsions.  Concern for possible epileptic activity.  Mother asking for patient to be seen today if possible.  She has seen Dr Pollie Meyer before and would like to see her if Dr Leveda Anna is not available.  Likely would need referral to Neuro for further evaluation.  Precautions discussed with mother.  She is to go to ED if patient sustains another seizure like episode.  Otherwise, patient to be see today by Dr Pollie Meyer .  Rayyan Burley M. Nadine Counts, DO PGY-2, [facility]

## 2015-06-15 ENCOUNTER — Ambulatory Visit: Payer: Medicaid Other | Admitting: Family Medicine

## 2015-06-15 NOTE — Progress Notes (Signed)
   Subjective:    Patient ID: Maxwell Rose, male    DOB: August 30, 1996, 19 y.o.   MRN: [id number]  HPI He is here with his mom today. Evidently last night she heard him making some unusual sounds while he was sleeping, went to the bedroom and observed him thrashing around. She thought it was seizure-type movements. She was eventually able to wake him up and he was normally alert upon awakening. He had no bowel or bladder incontinence.   Review of Systems He's not had any unusual movements other than per history of present illness. He denies any time loss. No unusual weight change except for his gradual, intentional weight loss. No fever, sweats, chills. No visual disturbances.    Objective:   Physical Exam  Vital signs reviewed. GENERAL: Well-developed, well-nourished, no acute distress. Obese CARDIOVASCULAR: Regular rate and rhythm no murmur gallop or rub LUNGS: Clear to auscultation bilaterally, no rales or wheeze. ABDOMEN: Soft positive bowel sounds NEURO: No gross focal neurological deficits. MSK: Movement of extremity x 4. Normal muscle bulk and tone        Assessment & Plan:

## 2015-06-15 NOTE — Assessment & Plan Note (Addendum)
Sounds like he may have had some type of sleep disturbance. I suspect is more related to sleep apnea. Given his body habitus, I'm surprised that he did not previously qualify on his sleep study although it appears a sleep study was not entirely satisfactory. The last sleep study that I could find was 3 years ago so I think we will set that up again. Mom's happy with this plan so is he. I reassured her that what she is describing does not sound like epilepsy or seizure.Greater than 50% of our 25 minute office visit was spent in counseling and education regarding these issues.

## 2015-06-20 ENCOUNTER — Ambulatory Visit (HOSPITAL_BASED_OUTPATIENT_CLINIC_OR_DEPARTMENT_OTHER): Payer: Medicaid Other | Attending: Family Medicine | Admitting: Radiology

## 2015-06-20 DIAGNOSIS — Z6841 Body Mass Index (BMI) 40.0 and over, adult: Secondary | ICD-10-CM | POA: Insufficient documentation

## 2015-06-20 DIAGNOSIS — R0683 Snoring: Secondary | ICD-10-CM | POA: Insufficient documentation

## 2015-06-20 DIAGNOSIS — R5383 Other fatigue: Secondary | ICD-10-CM | POA: Diagnosis present

## 2015-06-20 DIAGNOSIS — R400 Somnolence: Secondary | ICD-10-CM

## 2015-06-24 ENCOUNTER — Encounter (HOSPITAL_BASED_OUTPATIENT_CLINIC_OR_DEPARTMENT_OTHER): Payer: Medicaid Other | Admitting: Internal Medicine

## 2015-06-24 DIAGNOSIS — R400 Somnolence: Secondary | ICD-10-CM

## 2015-06-24 NOTE — Progress Notes (Signed)
   Patient Name: Maxwell Rose, Radilla Date: 06/20/2015 Gender: Male D.O.B: 11-17-1995 Age (years): 18 Referring Provider: Denny Levy Height (inches): 67 Interpreting Physician: Jetty Duhamel MD, ABSM Weight (lbs): 369 RPSGT: Shelah Lewandowsky BMI: 58 MRN: [id number] Neck Size: 18.00 CLINICAL INFORMATION Sleep Study Type: NPSG  Indication for sleep study: Daytime Fatigue, Fatigue, Morbid Obesity, Obesity, Snoring, Witnesses Apnea / Gasping During Sleep  Epworth Sleepiness Score: 10  SLEEP STUDY TECHNIQUE As per the AASM Manual for the Scoring of Sleep and Associated Events v2.3 (April 2016) with a hypopnea requiring 4% desaturations.  The channels recorded and monitored were frontal, central and occipital EEG, electrooculogram (EOG), submentalis EMG (chin), nasal and oral airflow, thoracic and abdominal wall motion, anterior tibialis EMG, snore microphone, electrocardiogram, and pulse oximetry.  MEDICATIONS Patient's medications include: charted for review.  Medications self-administered by patient during sleep study : No sleep medicine administered.  SLEEP ARCHITECTURE The study was initiated at 11:03:06 PM and ended at 5:24:25 AM.  Sleep onset time was 4.4 minutes and the sleep efficiency was 93.9%. The total sleep time was 357.9 minutes.  Stage REM latency was 222.0 minutes.  The patient spent 12.68% of the night in stage N1 sleep, 81.59% in stage N2 sleep, 0.84% in stage N3 and 4.89% in REM.  Alpha intrusion was absent.  Supine sleep was 70.13%.  Awake after sleep onset  19 minutes  RESPIRATORY PARAMETERS The overall apnea/hypopnea index (AHI) was 4.2 per hour. There were 19 total apneas, including 2 obstructive, 15 central and 2 mixed apneas. There were 6 hypopneas and 33 RERAs.  The AHI during Stage REM sleep was 24.0 per hour.  AHI while supine was 4.5 per hour.  The mean oxygen saturation was 95.70%. The minimum SpO2 during sleep was 84.00%.  Loud  snoring was noted during this study.  CARDIAC DATA The 2 lead EKG demonstrated sinus rhythm. The mean heart rate was 62.61 beats per minute. Other EKG findings include: None.  LEG MOVEMENT DATA The total PLMS were 0 with a resulting PLMS index of 0.00. Associated arousal with leg movement index was 0.0 .  IMPRESSIONS No significant obstructive sleep apnea occurred during this study (AHI = 4.2/h). The upper limit of normal is AHI 5/ hr. No significant central sleep apnea occurred during this study (CAI = 2.5/h). Mild oxygen desaturation was noted during this study (Min O2 = 84.00% , mean oxygen saturation 95.7%). The patient snored with Loud snoring volume. No cardiac abnormalities were noted during this study. Clinically significant periodic limb movements did not occur during sleep. No significant associated arousals.   DIAGNOSIS Primary Snoring (786.09 [R06.83 ICD-10]) Normal study  RECOMMENDATIONS Avoid alcohol, sedatives and other CNS depressants that may worsen sleep apnea and disrupt normal sleep architecture. Sleep hygiene should be reviewed to assess factors that may improve sleep quality. Weight management and regular exercise should be initiated or continued if appropriate.  Waymon Budge Diplomate, American Board of Sleep Medicine  ELECTRONICALLY SIGNED ON:  06/24/2015, 3:38 PM [facility] PH: [phone]   FX: [phone] ACCREDITED BY THE AMERICAN ACADEMY OF SLEEP MEDICINE

## 2015-06-25 ENCOUNTER — Encounter (HOSPITAL_BASED_OUTPATIENT_CLINIC_OR_DEPARTMENT_OTHER): Payer: Medicaid Other | Admitting: Internal Medicine

## 2015-06-25 DIAGNOSIS — R400 Somnolence: Secondary | ICD-10-CM | POA: Diagnosis not present

## 2015-08-30 ENCOUNTER — Ambulatory Visit (HOSPITAL_BASED_OUTPATIENT_CLINIC_OR_DEPARTMENT_OTHER): Payer: Medicaid Other

## 2016-07-31 ENCOUNTER — Encounter: Payer: Self-pay | Admitting: Family Medicine

## 2016-07-31 ENCOUNTER — Ambulatory Visit (INDEPENDENT_AMBULATORY_CARE_PROVIDER_SITE_OTHER): Payer: Medicaid Other | Admitting: Family Medicine

## 2016-07-31 DIAGNOSIS — Z23 Encounter for immunization: Secondary | ICD-10-CM | POA: Diagnosis not present

## 2016-07-31 DIAGNOSIS — K7581 Nonalcoholic steatohepatitis (NASH): Secondary | ICD-10-CM | POA: Diagnosis not present

## 2016-07-31 DIAGNOSIS — H9193 Unspecified hearing loss, bilateral: Secondary | ICD-10-CM

## 2016-07-31 DIAGNOSIS — M228X1 Other disorders of patella, right knee: Secondary | ICD-10-CM | POA: Diagnosis not present

## 2016-07-31 DIAGNOSIS — Z114 Encounter for screening for human immunodeficiency virus [HIV]: Secondary | ICD-10-CM

## 2016-07-31 DIAGNOSIS — I10 Essential (primary) hypertension: Secondary | ICD-10-CM

## 2016-07-31 DIAGNOSIS — F322 Major depressive disorder, single episode, severe without psychotic features: Secondary | ICD-10-CM

## 2016-07-31 LAB — COMPLETE METABOLIC PANEL WITH GFR
ALT: 62 U/L — AB (ref 8–46)
AST: 35 U/L — ABNORMAL HIGH (ref 12–32)
Albumin: 4.2 g/dL (ref 3.6–5.1)
Alkaline Phosphatase: 46 U/L — ABNORMAL LOW (ref 48–230)
BUN: 9 mg/dL (ref 7–20)
CHLORIDE: 107 mmol/L (ref 98–110)
CO2: 22 mmol/L (ref 20–31)
Calcium: 9.1 mg/dL (ref 8.9–10.4)
Creat: 1.03 mg/dL (ref 0.60–1.26)
GFR, Est African American: >89 mL/min (ref >=60)
GLUCOSE: 80 mg/dL (ref 65–99)
Potassium: 4.1 mmol/L (ref 3.8–5.1)
SODIUM: 141 mmol/L (ref 135–146)
TOTAL PROTEIN: 7 g/dL (ref 6.3–8.2)
Total Bilirubin: 0.4 mg/dL (ref 0.2–1.1)

## 2016-07-31 LAB — TSH: TSH: 4.18 mIU/L (ref 0.50–4.30)

## 2016-07-31 NOTE — Assessment & Plan Note (Signed)
Asymptomatic, check LFTs

## 2016-07-31 NOTE — Assessment & Plan Note (Signed)
Once again reiterated importance of controling wt.  Mentioned bariatric surg.  Patient does not think he would benefit from nutritionist.

## 2016-07-31 NOTE — Assessment & Plan Note (Signed)
Given complaints of decreased hearing, hx of chronic infections, will get ENT referral.

## 2016-07-31 NOTE — Assessment & Plan Note (Signed)
Poor control.  Likely combo of weight and non compliance with HCTZ.  Restart HCTZ and recheck in one month.

## 2016-07-31 NOTE — Progress Notes (Signed)
   Subjective:    Patient ID: Maxwell Rose, male    DOB: 10-07-1996, 20 y.o.   MRN: [id number]  HPI One year since last visit.  Multiple issues. 1. Morbid obesity.  25 lb wt gain.  States very active - moving all the time.  Recognizes that food intake is the key for him.  Committed to losing wt.  For the first time, I mentioned bariatric surgery. 2. HBP.  BP up today.  For some reason, he has not been taking his HCTZ daily.  Does take other meds 3. Excessive sweating.  Likely due to wt.  Check TSH. 4. Hx of NASH - asymptomatic. 5. Never checked for HIV.  Wants screen. 6. Rt knee pain.  No trauma.  Mostly with bending.  On and off for months. 7. States bilateral hearing loss.  Hx of recurrent infections. 8. Depression.  Well controled on prozac.  Graduated from high school Very active in church.  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    Review of Systems     Objective:   Physical Exam  Left TM thickened. Right TM seems either scarred, less likely, I am seeing a cholesteatoma. Lungs clear Cardiac RRR without m or g Neck no thyromegally. Abd benign.  Unable to palpate liver.   Right knee - pain and crepitis with patellar compression.  Ligaments stable to provocative testing.        Assessment & Plan:

## 2016-07-31 NOTE — Patient Instructions (Signed)
See me in one month to recheck weight and blood pressure. For your ear, I will put in an Ear Nose and Throat referral.  The nurse should call about an appointment. You will get a flu shot and tetanus shot today. I will call with blood test results For your high blood pressure, take your HCTZ every day. Weight loss, weight loss, weight loss. Read up on bariatric surgery - if you can't make progress on your own, I will be recommending it.   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

## 2016-07-31 NOTE — Assessment & Plan Note (Signed)
Screen x 1.  Low risk behavior.

## 2016-07-31 NOTE — Assessment & Plan Note (Signed)
Stable on prozac.  No elements of bipolar.

## 2016-08-01 ENCOUNTER — Encounter: Payer: Self-pay | Admitting: Family Medicine

## 2016-08-01 LAB — HIV ANTIBODY (ROUTINE TESTING W REFLEX): HIV 1&2 Ab, 4th Generation: NONREACTIVE

## 2016-08-15 ENCOUNTER — Ambulatory Visit (INDEPENDENT_AMBULATORY_CARE_PROVIDER_SITE_OTHER): Payer: Medicaid Other | Admitting: Family Medicine

## 2016-08-15 ENCOUNTER — Encounter: Payer: Self-pay | Admitting: Family Medicine

## 2016-08-15 VITALS — BP 143/91 | HR 95 | Temp 98.3°F | Wt 396.0 lb

## 2016-08-15 DIAGNOSIS — J069 Acute upper respiratory infection, unspecified: Secondary | ICD-10-CM

## 2016-08-15 DIAGNOSIS — B9789 Other viral agents as the cause of diseases classified elsewhere: Secondary | ICD-10-CM | POA: Diagnosis not present

## 2016-08-15 MED ORDER — BENZONATATE 200 MG PO CAPS: 200.0000 mg | ORAL_CAPSULE | Freq: Two times a day (BID) | ORAL | 0 refills | Status: AC | PRN

## 2016-08-15 MED ORDER — OXYMETAZOLINE HCL 0.05 % NA SOLN: 1.0000 | Freq: Two times a day (BID) | NASAL | 0 refills | Status: DC

## 2016-08-15 MED ORDER — FLUTICASONE PROPIONATE 50 MCG/ACT NA SUSP: 2.0000 | Freq: Every day | NASAL | 1 refills | Status: DC

## 2016-08-15 NOTE — Progress Notes (Signed)
    Subjective:  Maxwell Rose is a 20 y.o. male who [facility] with a chief complaint of cough.   HPI: URI Symptoms Started 2 days ago. Symptom include cough, sneeze, congestion, rhinorrhea, right ear ache, and sore throat. Decreased energy and appetite. No fevers or chills. No known sick contacts. Has not tired any treatments or medications. Nothing obvious that makes it better or worse.   ROS: Per HPI  Objective:  Physical Exam: BP (!) 143/91   Pulse 95   Temp 98.3 F (36.8 C) (Oral)   Wt (!) 396 lb (179.6 kg)   SpO2 98%   BMI 62.02 kg/m   Gen: NAD, resting comfortably HEENT: TMs clear bilaterally. OP clear. Clear discharge noted in nares bilaterally.  CV: Distant heart sounds. RRR with no murmurs appreciated Pulm: NWOB, Distant breath sounds. CTAB with no crackles, wheezes, or rhonchi GI: Morbidly obese, Normal bowel sounds present. Soft, Nontender, Nondistended. MSK: no edema, cyanosis, or clubbing noted Skin: warm, dry Neuro: grossly normal, moves all extremities Psych: Normal affect and thought content  Assessment/Plan:  Viral URI with Cough Symptoms consistent with viral URI. No signs of bacterial infection. Will treat with flonase, tessalon as needed, and 3 days of afrin. Return precautions reviewed. Follow up as needed.   Katina Degreealeb M. Jimmey RalphParker, MD [facility] Resident PGY-3 08/15/2016 10:12 AM

## 2016-08-15 NOTE — Patient Instructions (Signed)
You have a cold.  Take the afrin for the next 3 days.  Start the flonase today and take for the next couple of weeks.  Use the tessalon as needed for cough.  USe tylenol or ibuprofen as needed for pain.  Take care,  Dr Jimmey RalphParker    Upper Respiratory Infection, Adult Most upper respiratory infections (URIs) are caused by a virus. A URI affects the nose, throat, and upper air passages. The most common type of URI is often called "the common cold." HOME CARE   Take medicines only as told by your doctor.  Gargle warm saltwater or take cough drops to comfort your throat as told by your doctor.  Use a warm mist humidifier or inhale steam from a shower to increase air moisture. This may make it easier to breathe.  Drink enough fluid to keep your pee (urine) clear or pale yellow.  Eat soups and other clear broths.  Have a healthy diet.  Rest as needed.  Go back to work when your fever is gone or your doctor says it is okay.  You may need to stay home longer to avoid giving your URI to others.  You can also wear a face mask and wash your hands often to prevent spread of the virus.  Use your inhaler more if you have asthma.  Do not use any tobacco products, including cigarettes, chewing tobacco, or electronic cigarettes. If you need help quitting, ask your doctor. GET HELP IF:  You are getting worse, not better.  Your symptoms are not helped by medicine.  You have chills.  You are getting more short of breath.  You have brown or red mucus.  You have yellow or brown discharge from your nose.  You have pain in your face, especially when you bend forward.  You have a fever.  You have puffy (swollen) neck glands.  You have pain while swallowing.  You have white areas in the back of your throat. GET HELP RIGHT AWAY IF:   You have very bad or constant:  Headache.  Ear pain.  Pain in your forehead, behind your eyes, and over your cheekbones (sinus pain).  Chest  pain.  You have long-lasting (chronic) lung disease and any of the following:  Wheezing.  Long-lasting cough.  Coughing up blood.  A change in your usual mucus.  You have a stiff neck.  You have changes in your:  Vision.  Hearing.  Thinking.  Mood. MAKE SURE YOU:   Understand these instructions.  Will watch your condition.  Will get help right away if you are not doing well or get worse.   This information is not intended to replace advice given to you by your health care provider. Make sure you discuss any questions you have with your health care provider.   Document Released: 03/25/2008 Document Revised: 02/21/2015 Document Reviewed: 01/12/2014 Elsevier Interactive Patient Education [employer]2016 Elsevier Inc.

## 2016-09-18 ENCOUNTER — Ambulatory Visit: Payer: Medicaid Other | Admitting: Family Medicine

## 2016-10-24 ENCOUNTER — Emergency Department (HOSPITAL_COMMUNITY): Payer: Medicaid Other

## 2016-10-24 ENCOUNTER — Emergency Department (HOSPITAL_COMMUNITY)
Admission: EM | Admit: 2016-10-24 | Discharge: 2016-10-24 | Disposition: A | Discharge status: Home / Self Care | Payer: Medicaid Other | Attending: Emergency Medicine | Admitting: Emergency Medicine

## 2016-10-24 ENCOUNTER — Encounter (HOSPITAL_COMMUNITY): Payer: Self-pay | Admitting: Emergency Medicine

## 2016-10-24 DIAGNOSIS — H66003 Acute suppurative otitis media without spontaneous rupture of ear drum, bilateral: Secondary | ICD-10-CM | POA: Insufficient documentation

## 2016-10-24 DIAGNOSIS — Z79899 Other long term (current) drug therapy: Secondary | ICD-10-CM | POA: Diagnosis not present

## 2016-10-24 DIAGNOSIS — J029 Acute pharyngitis, unspecified: Secondary | ICD-10-CM | POA: Diagnosis present

## 2016-10-24 DIAGNOSIS — I10 Essential (primary) hypertension: Secondary | ICD-10-CM | POA: Diagnosis not present

## 2016-10-24 DIAGNOSIS — R6889 Other general symptoms and signs: Secondary | ICD-10-CM

## 2016-10-24 LAB — COMPREHENSIVE METABOLIC PANEL
ALK PHOS: 44 U/L (ref 38–126)
ALT: 39 U/L (ref 17–63)
ANION GAP: 11 (ref 5–15)
AST: 28 U/L (ref 15–41)
Albumin: 3.8 g/dL (ref 3.5–5.0)
BUN: 10 mg/dL (ref 6–20)
CALCIUM: 9.3 mg/dL (ref 8.9–10.3)
CO2: 22 mmol/L (ref 22–32)
Chloride: 104 mmol/L (ref 101–111)
Creatinine, Ser: 1.16 mg/dL (ref 0.61–1.24)
GFR calc non Af Amer: >60 mL/min (ref >60)
Glucose, Bld: 107 mg/dL — ABNORMAL HIGH (ref 65–99)
Potassium: 3.9 mmol/L (ref 3.5–5.1)
Sodium: 137 mmol/L (ref 135–145)
Total Bilirubin: 0.8 mg/dL (ref 0.3–1.2)
Total Protein: 7.5 g/dL (ref 6.5–8.1)

## 2016-10-24 LAB — CBC WITH DIFFERENTIAL/PLATELET
BASOS ABS: 0 10*3/uL (ref 0.0–0.1)
Basophils Relative: 0 %
EOS ABS: 0.3 10*3/uL (ref 0.0–0.7)
EOS PCT: 2 %
HCT: 43.4 % (ref 39.0–52.0)
Hemoglobin: 15.5 g/dL (ref 13.0–17.0)
Lymphocytes Relative: 32 %
Lymphs Abs: 3.8 10*3/uL (ref 0.7–4.0)
MCH: 28.7 pg (ref 26.0–34.0)
MCHC: 35.7 g/dL (ref 30.0–36.0)
MCV: 80.4 fL (ref 78.0–100.0)
Monocytes Absolute: 0.8 10*3/uL (ref 0.1–1.0)
Monocytes Relative: 7 %
NEUTROS PCT: 59 %
Neutro Abs: 7 10*3/uL (ref 1.7–7.7)
Platelets: 332 10*3/uL (ref 150–400)
RBC: 5.4 MIL/uL (ref 4.22–5.81)
RDW: 12.5 % (ref 11.5–15.5)
WBC: 11.9 10*3/uL — AB (ref 4.0–10.5)

## 2016-10-24 LAB — RAPID STREP SCREEN (MED CTR MEBANE ONLY): STREPTOCOCCUS, GROUP A SCREEN (DIRECT): NEGATIVE

## 2016-10-24 LAB — I-STAT CG4 LACTIC ACID, ED: Lactic Acid, Venous: 1.89 mmol/L (ref 0.5–1.9)

## 2016-10-24 LAB — CBG MONITORING, ED: GLUCOSE-CAPILLARY: 114 mg/dL — AB (ref 65–99)

## 2016-10-24 MED ORDER — AZITHROMYCIN 250 MG PO TABS: 250.0000 mg | ORAL_TABLET | Freq: Every day | ORAL | 0 refills | Status: DC

## 2016-10-24 MED ORDER — SODIUM CHLORIDE 0.9 % IV BOLUS (SEPSIS)
1000.0000 mL | Freq: Once | INTRAVENOUS | Status: AC
  Administered 2016-10-24: 1000 mL via INTRAVENOUS

## 2016-10-24 MED ORDER — ONDANSETRON HCL 4 MG/2ML IJ SOLN
4.0000 mg | Freq: Once | INTRAMUSCULAR | Status: AC
  Administered 2016-10-24: 4 mg via INTRAVENOUS
  Filled 2016-10-24: qty 2

## 2016-10-24 MED ORDER — HYDROCOD POLST-CPM POLST ER 10-8 MG/5ML PO SUER
5.0000 mL | Freq: Once | ORAL | Status: AC
  Administered 2016-10-24: 5 mL via ORAL
  Filled 2016-10-24: qty 5

## 2016-10-24 NOTE — ED Triage Notes (Signed)
Pt presents to ED for assessment after 1 week of flu-like symptoms (sore throat, fevers, body aches, cough, vomiting).  Mother is concerned patient is dehydrated and she states symptoms are getting better rather than worse.

## 2016-10-24 NOTE — ED Provider Notes (Signed)
[facility]-EM[facility]Y DEPT Provider Note   CSN: [id number] Arrival date & time: 10/24/16  0140   By signing my name below, I, Nelwyn Salisbury, attest that this documentation has been prepared under the direction and in the presence of Zadie Rhine, MD . Electronically Signed: Nelwyn Salisbury, Scribe. 10/24/2016. 3:16 AM.  History   Chief Complaint Chief Complaint  Patient presents with  . flu like symptoms   The history is provided by the patient and a relative. No language interpreter was used.  Sore Throat  This is a new problem. The current episode started more than 2 days ago. The problem occurs constantly. The problem has not changed since onset.Associated symptoms include headaches. Pertinent negatives include no chest pain and no abdominal pain. The symptoms are aggravated by swallowing. Nothing relieves the symptoms. He has tried nothing for the symptoms. The treatment provided no relief.    HPI Comments:  Maxwell Rose is a 22 y.o. male with pmhx of hearing difficulty and ear tube placement who presents to the Emergency Department complaining of gradual onset constant moderate sore throat onset 7 days ago. Pt reports associated headache, fever (tmax 103), productive cough, myalgias, vomiting, nausea and syncope. Pt states his sore throat is exacerbated by swallowing. No alleviating factors indicated. Pt's mother reports occasional blood in the pt's mucous produced from coughing. Pt denies any diarrhea, chest pain or abdominal pain.  Pt is a nonsmoker.   Past Medical History:  Diagnosis Date  . Bipolar 1 disorder (HCC)   . Depression   . GERD (gastroesophageal reflux disease)   . Hearing difficulty   . Hiatal hernia   . Hiatal hernia   . Hypertension   . Obesity   . Vision abnormalities    near sited    Patient Active Problem List   Diagnosis Date Noted  . Screening for HIV (human immunodeficiency virus) 07/31/2016  . 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 Morbid obesity with body mass index of 50 or higher (HCC) 06/29/2012  . Hypertension 12/06/2010  . Sleep apnea 10/26/2010  . 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 Severe major depression without psychotic features (HCC) 07/30/2010  . GERD 10/03/2009  . NASH (nonalcoholic steatohepatitis) 05/31/2009    Past Surgical History:  Procedure Laterality Date  . ADENOIDECTOMY    . TOOTH EXTRACTION N/A 01/31/2014   Procedure: EXTRACTION 3rd MOLARS;  Surgeon: Georgia Lopes, DDS;  Location: [facility] OR;  Service: Oral Surgery;  Laterality: N/A;  . TYMPANOSTOMY TUBE PLACEMENT     sev       Home Medications    Prior to Admission medications   Medication Sig Start Date End Date Taking? Authorizing Provider  FLUoxetine (PROZAC) 40 MG capsule Take 1 capsule (40 mg total) by mouth daily. 04/21/15   Moses Manners, MD  fluticasone (FLONASE) 50 MCG/ACT nasal spray Place 2 sprays into both nostrils daily. 08/15/16   Ardith Dark, MD  hydrochlorothiazide (HYDRODIURIL) 25 MG tablet Take 1 tablet (25 mg total) by mouth daily. Patient not taking: Reported on 07/31/2016 04/21/15   Moses Manners, MD  metoprolol succinate (TOPROL-XL) 50 MG 24 hr tablet Take 1 tablet (50 mg total) by mouth daily. Take with or immediately following a meal. 04/21/15   Moses Manners, MD  omeprazole (PRILOSEC) 40 MG capsule Take 1 capsule (40 mg total) by mouth daily. 04/21/15   Moses Manners, MD  oxymetazoline (AFRIN NASAL SPRAY) 0.05 % nasal spray Place 1 spray into both nostrils 2 (two) times daily. 08/15/16   Ardith Dark, MD    Family History Family History  Problem Relation Age of Onset  . Depression Mother   . Mental illness Mother   . Varicose Veins Mother   . Asthma Sister   . Learning disabilities Sister   . Arthritis Maternal Grandmother   . Cancer Maternal Grandmother   . COPD Maternal Grandmother   . Depression Maternal  Grandmother   . Heart disease Maternal Grandmother   . Hyperlipidemia Maternal Grandmother   . Hypertension Maternal Grandmother   . Arthritis Maternal Grandfather   . COPD Maternal Grandfather   . Diabetes Maternal Grandfather   . Hyperlipidemia Maternal Grandfather   . Hypertension Maternal Grandfather   . Arthritis Paternal Grandmother   . Arthritis Paternal Grandfather     Social History Social History  Substance Use Topics  . Smoking status: Never Smoker  . Smokeless tobacco: Never Used  . Alcohol use No     Allergies   Penicillins   Review of Systems Review of Systems  Constitutional: Positive for fever.  HENT: Positive for sore throat.   Respiratory: Positive for cough.   Cardiovascular: Negative for chest pain.  Gastrointestinal: Positive for nausea and vomiting. Negative for abdominal pain.  Musculoskeletal: Positive for myalgias.  Neurological: Positive for syncope and headaches.  All other systems reviewed and are negative.    Physical Exam Updated Vital Signs BP 126/79 (BP Location: Right Arm)   Pulse 114   Temp 99.4 F (37.4 C) (Oral)   Resp 22   Ht 5\' 7"  (1.702 m)   Wt (!) 387 lb 1 oz (175.6 kg)   SpO2 100%   BMI 60.62 kg/m   Physical Exam CONSTITUTIONAL: Well developed/well nourished HEAD: Normocephalic/atraumatic EYES: EOMI/PERRL ENMT: Mucous membranes moist. Uvula midline, no exudates, no trismus, no drooling, mild erythema noted, no stridor noted, no blood noted. Bilateral TMs bulging with erythema noted.  NECK: supple no meningeal signs SPINE/BACK:entire spine nontender CV: S1/S2 noted, no murmurs/rubs/gallops noted LUNGS: Lungs are clear to auscultation bilaterally, no apparent distress ABDOMEN: soft, nontender, no rebound or guarding, bowel sounds noted throughout abdomen GU:no cva tenderness NEURO: Pt is awake/alert/appropriate, moves all extremitiesx4.  No facial droop.   EXTREMITIES: pulses normal/equal, full ROM SKIN: warm,  color normal PSYCH: no abnormalities of mood noted, alert and oriented to situation   ED Treatments / Results  DIAGNOSTIC STUDIES:  Oxygen Saturation is 100% on RA, normal by my interpretation.    COORDINATION OF CARE:  3:31 AM Discussed treatment plan with pt at bedside which includes Tussinex and pt agreed to plan.   Labs (all labs ordered are listed, but only abnormal results are displayed) Labs Reviewed  COMPREHENSIVE METABOLIC PANEL - Abnormal; Notable for the following:       Result Value   Glucose, Bld 107 (*)    All other components within normal limits  CBC WITH DIFFERENTIAL/PLATELET - Abnormal; Notable for the following:    WBC 11.9 (*)    All other components within normal limits  CBG MONITORING, ED - Abnormal; Notable for the following:    Glucose-Capillary 114 (*)    All other components within normal limits  RAPID STREP SCREEN (NOT AT [facility])  CULTURE, GROUP A STREP [facility])  I-STAT CG4 LACTIC ACID, ED    EKG  EKG Interpretation  Date/Time:  Thursday October 24 2016 02:15:18 EST Ventricular Rate:  93 PR Interval:    QRS Duration: 79 QT Interval:  345 QTC Calculation: 430 R Axis:   52 Text Interpretation:  Sinus rhythm Borderline prolonged PR interval No significant change since last tracing Confirmed by Bebe ShaggyWICKLINE  MD, Paulmichael Schreck ([id number]) on 10/24/2016 2:18:00 AM       Radiology Dg Chest 2 View  Result Date: 10/24/2016 CLINICAL DATA:  Flu symptoms, cough and vomiting for 1 week. EXAM: CHEST  2 VIEW COMPARISON:  Chest radiograph July 18, 2011 FINDINGS: Cardiomediastinal silhouette is normal. No pleural effusions or focal consolidations. Trachea projects midline and there is no pneumothorax. Soft tissue planes and included osseous structures are non-suspicious. Large body habitus. IMPRESSION: Normal chest. Electronically Signed   By: Awilda Metroourtnay  Bloomer M.D.   On: 10/24/2016 02:35    Procedures Procedures (including critical care time)  Medications Ordered in  ED Medications  chlorpheniramine-HYDROcodone (TUSSIONEX) 10-8 MG/5ML suspension 5 mL (5 mLs Oral Given 10/24/16 0418)  ondansetron (ZOFRAN) injection 4 mg (4 mg Intravenous Given 10/24/16 0539)  sodium chloride 0.9 % bolus 1,000 mL (0 mLs Intravenous Stopped 10/24/16 0648)     Initial Impression / Assessment and Plan / ED Course  I have reviewed the triage vital signs and the nursing notes.  Pertinent labs & imaging results that were available during my care of the patient were reviewed by me and considered in my medical decision making (see chart for details).  Clinical Course     Pt given IV fluids Pt given benzocaine spray with improvement of sore throat He is taking PO fluids Will treat for otitis media Advised PCP f/u in 1-2 weeks   Final Clinical Impressions(s) / ED Diagnoses   Final diagnoses:  Flu-like symptoms  Pharyngitis, unspecified etiology  Acute suppurative otitis media of both ears without spontaneous rupture of tympanic membranes, recurrence not specified    New Prescriptions Discharge Medication List as of 10/24/2016  6:26 AM    START taking these medications   Details  azithromycin (ZITHROMAX) 250 MG tablet Take 1 tablet (250 mg total) by mouth daily. Take first 2 tablets together, then 1 every day until finished., Starting Thu 10/24/2016, Print      I personally performed the services described in this documentation, which was scribed in my presence. The recorded information has been reviewed and is accurate.        Zadie Rhineonald Tunisha Ruland, MD 10/24/16 [phone]

## 2016-10-24 NOTE — ED Notes (Signed)
Attempted to swab patient's throat and pt c/o feeling nauseous and light-headed.  Pt laid back and legs elevated.  Pt threw himself forward to vomit and threw himself out of the chair and on to the ground.  Patient alert during whole incident, able to stand up (with assistance) and get in to a wheelchair.  Pt taken straight back to room, denies any new pain or injuries after fall.

## 2016-10-25 LAB — CULTURE, GROUP A STREP (THRC)

## 2016-10-31 ENCOUNTER — Encounter: Payer: Self-pay | Admitting: Family Medicine

## 2016-10-31 ENCOUNTER — Ambulatory Visit (INDEPENDENT_AMBULATORY_CARE_PROVIDER_SITE_OTHER): Payer: Medicaid Other | Admitting: Family Medicine

## 2016-10-31 VITALS — BP 118/72 | HR 87 | Temp 98.7°F | Ht 67.0 in | Wt 386.4 lb

## 2016-10-31 DIAGNOSIS — I10 Essential (primary) hypertension: Secondary | ICD-10-CM

## 2016-10-31 DIAGNOSIS — A084 Viral intestinal infection, unspecified: Secondary | ICD-10-CM

## 2016-10-31 DIAGNOSIS — K219 Gastro-esophageal reflux disease without esophagitis: Secondary | ICD-10-CM

## 2016-10-31 DIAGNOSIS — S40811A Abrasion of right upper arm, initial encounter: Secondary | ICD-10-CM | POA: Diagnosis not present

## 2016-10-31 DIAGNOSIS — Z23 Encounter for immunization: Secondary | ICD-10-CM

## 2016-10-31 MED ORDER — OMEPRAZOLE 40 MG PO CPDR: 40.0000 mg | DELAYED_RELEASE_CAPSULE | Freq: Every day | ORAL | 3 refills | Status: DC

## 2016-10-31 NOTE — Patient Instructions (Addendum)
You had some sort of unnamed virus causing your illness.  I am glad you are feeling better.   I hope you don't put all the weight back on.  Weight loss is vital to your long term health.   You need to build a life on your own. Tetanus shot today.  They are good for 10 years. In the future, if you have a long lasting illness that makes you quit eating for more that 3 days, quit taking your hydrochlorothiazide while ill.  Still take your other meds.  Restart the HCTZ when you start eating again.  Use the flonase every single day.  Use the Afrin only as needed.  Be careful not get your nose addicted to it.

## 2016-10-31 NOTE — Progress Notes (Signed)
   Subjective:    Patient ID: Maxwell Rose, male    DOB: [birth year], 21 y.o.   MRN: [id number]  HPI FU ER, sore throat resp. Illness x 1 week.  Neg strep.  CBC did not suggest mono.  Now feels better.  While in ER, passed out.  EKG and CXR normal.  Feels likely due to illness plus poor PO.  Discussed stopping diuretics if future illness to avoid syncope.  When passed out, had abrasion to right arm.  Due for tetanus. Scheduled for nasal surg plus bilateral tympanostomy tubes.  We once again discussed wt    Review of Systems     Objective:   Physical Exam VS and wt noted. Lungs clear Cardiac RRR without m or g Abd benign Mucous membranes well hydrated.       Assessment & Plan:

## 2016-11-04 DIAGNOSIS — A084 Viral intestinal infection, unspecified: Secondary | ICD-10-CM | POA: Insufficient documentation

## 2016-11-04 NOTE — Assessment & Plan Note (Signed)
Healing well.  Needs booster tetanus.

## 2016-11-04 NOTE — Assessment & Plan Note (Signed)
Discussed again wt loss.

## 2016-11-04 NOTE — Assessment & Plan Note (Signed)
Resolving.  No additional Rx needed.

## 2016-11-04 NOTE — Assessment & Plan Note (Signed)
Discussed role of BP agents when sick.  Likely contributed to his syncope.

## 2016-11-05 ENCOUNTER — Other Ambulatory Visit: Payer: Self-pay | Admitting: Otolaryngology

## 2016-11-07 ENCOUNTER — Other Ambulatory Visit (HOSPITAL_COMMUNITY): Payer: Self-pay | Admitting: *Deleted

## 2016-11-07 ENCOUNTER — Encounter (HOSPITAL_COMMUNITY): Payer: Self-pay

## 2016-11-07 ENCOUNTER — Inpatient Hospital Stay (HOSPITAL_COMMUNITY)
Admission: RE | Admit: 2016-11-07 | Discharge: 2016-11-07 | Disposition: A | Discharge status: Home / Self Care | Payer: Medicaid Other | Source: Ambulatory Visit

## 2016-11-07 HISTORY — DX: Anxiety disorder, unspecified: F41.9

## 2016-11-07 NOTE — Progress Notes (Signed)
This pt. Scored at an elevated risk for OSA  Using the STOP Bang Tool during a pre-surgical visit. A score of 5 or greater is considered an elevated risk.

## 2016-11-07 NOTE — Pre-Procedure Instructions (Signed)
    Montravious L Gaba  11/07/2016      [facility] - [place] - [address] [place] [address] [place] [address] [place] [address] Phone: [phone] Fax: [phone]  [id number] - [place], [place] - [address] [facility] [address] [address] [place] [address] Phone: [phone] Fax: [phone]    Your procedure is scheduled on 11-08-2016   Friday .  [facility] at 8:20 A.M.   Call this number if you have problems the morning of surgery:  [phone]   Remember:  Do not eat food or drink liquids after midnight.   Take these medicines the morning of surgery with A SIP OF WATER Fluoxetine(Prozac),Flonase nasal spray as needed,metoprolol(Toprol XL),omeprezole(Prilosec),Afrin nasal spray if needed     Do not wear jewelry,   Do not wear lotions, powders, or perfumes, or deoderant.  Do not shave 48 hours prior to surgery.  Men may shave face and neck.   Do not bring valuables to the hospital.  [facility] is not responsible for any belongings or valuables.  Contacts, dentures or bridgework may not be worn into surgery.  Leave your suitcase in the car.  After surgery it may be brought to your room.  For patients admitted to the hospital, discharge time will be determined by your treatment team.  Patients discharged the day of surgery will not be allowed to drive home.    Special instructions:  See attached Sheet for instructions on CHG showers

## 2016-11-07 NOTE — Progress Notes (Signed)
This pt. Tested at an elevated risk for OSA using the STOP BANG TOOL during a pre-surgical visit. A score of 5 or greater is considered an elevated risk.

## 2016-11-08 ENCOUNTER — Ambulatory Visit (HOSPITAL_COMMUNITY): Payer: Medicaid Other | Admitting: Anesthesiology

## 2016-11-08 ENCOUNTER — Ambulatory Visit (HOSPITAL_COMMUNITY)
Admission: RE | Admit: 2016-11-08 | Discharge: 2016-11-08 | Disposition: A | Discharge status: Home / Self Care | Payer: Medicaid Other | Source: Ambulatory Visit | Attending: Otolaryngology | Admitting: Otolaryngology

## 2016-11-08 ENCOUNTER — Encounter (HOSPITAL_COMMUNITY): Payer: Self-pay | Admitting: *Deleted

## 2016-11-08 ENCOUNTER — Encounter (HOSPITAL_COMMUNITY)
Admission: RE | Disposition: A | Discharge status: Home / Self Care | Payer: Self-pay | Source: Ambulatory Visit | Attending: Otolaryngology

## 2016-11-08 DIAGNOSIS — F419 Anxiety disorder, unspecified: Secondary | ICD-10-CM | POA: Insufficient documentation

## 2016-11-08 DIAGNOSIS — J343 Hypertrophy of nasal turbinates: Secondary | ICD-10-CM

## 2016-11-08 DIAGNOSIS — H6520 Chronic serous otitis media, unspecified ear: Secondary | ICD-10-CM | POA: Insufficient documentation

## 2016-11-08 DIAGNOSIS — Z6841 Body Mass Index (BMI) 40.0 and over, adult: Secondary | ICD-10-CM | POA: Insufficient documentation

## 2016-11-08 DIAGNOSIS — H6690 Otitis media, unspecified, unspecified ear: Secondary | ICD-10-CM

## 2016-11-08 DIAGNOSIS — I10 Essential (primary) hypertension: Secondary | ICD-10-CM | POA: Diagnosis not present

## 2016-11-08 DIAGNOSIS — K219 Gastro-esophageal reflux disease without esophagitis: Secondary | ICD-10-CM | POA: Diagnosis not present

## 2016-11-08 DIAGNOSIS — G4730 Sleep apnea, unspecified: Secondary | ICD-10-CM | POA: Insufficient documentation

## 2016-11-08 DIAGNOSIS — H902 Conductive hearing loss, unspecified: Secondary | ICD-10-CM | POA: Diagnosis not present

## 2016-11-08 DIAGNOSIS — Z79899 Other long term (current) drug therapy: Secondary | ICD-10-CM | POA: Insufficient documentation

## 2016-11-08 DIAGNOSIS — F319 Bipolar disorder, unspecified: Secondary | ICD-10-CM | POA: Insufficient documentation

## 2016-11-08 HISTORY — PX: TURBINATE REDUCTION: SHX6157

## 2016-11-08 HISTORY — PX: MYRINGOTOMY WITH TUBE PLACEMENT: SHX5663

## 2016-11-08 LAB — BASIC METABOLIC PANEL
Anion gap: 13 (ref 5–15)
BUN: 15 mg/dL (ref 6–20)
CHLORIDE: 107 mmol/L (ref 101–111)
CO2: 19 mmol/L — ABNORMAL LOW (ref 22–32)
Calcium: 9.3 mg/dL (ref 8.9–10.3)
Creatinine, Ser: 1.08 mg/dL (ref 0.61–1.24)
GFR calc Af Amer: >60 mL/min (ref >60)
GFR calc non Af Amer: >60 mL/min (ref >60)
GLUCOSE: 102 mg/dL — AB (ref 65–99)
POTASSIUM: 4 mmol/L (ref 3.5–5.1)
Sodium: 139 mmol/L (ref 135–145)

## 2016-11-08 LAB — CBC
HEMATOCRIT: 42.7 % (ref 39.0–52.0)
Hemoglobin: 14.6 g/dL (ref 13.0–17.0)
MCH: 27.8 pg (ref 26.0–34.0)
MCHC: 34.2 g/dL (ref 30.0–36.0)
MCV: 81.3 fL (ref 78.0–100.0)
Platelets: 300 10*3/uL (ref 150–400)
RBC: 5.25 MIL/uL (ref 4.22–5.81)
RDW: 13.3 % (ref 11.5–15.5)
WBC: 9.6 10*3/uL (ref 4.0–10.5)

## 2016-11-08 SURGERY — MYRINGOTOMY WITH TUBE PLACEMENT
Anesthesia: General | Site: Nose

## 2016-11-08 MED ORDER — CIPROFLOXACIN IN D5W 400 MG/200ML IV SOLN
400.0000 mg | Freq: Once | INTRAVENOUS | Status: AC
  Administered 2016-11-08: 400 mg via INTRAVENOUS
  Filled 2016-11-08 (×2): qty 200

## 2016-11-08 MED ORDER — PROPOFOL 10 MG/ML IV BOLUS
INTRAVENOUS | Status: AC
  Filled 2016-11-08: qty 40

## 2016-11-08 MED ORDER — HYDROMORPHONE HCL 1 MG/ML IJ SOLN
0.2500 mg | INTRAMUSCULAR | Status: DC | PRN
  Administered 2016-11-08: 0.5 mg via INTRAVENOUS

## 2016-11-08 MED ORDER — MUPIROCIN CALCIUM 2 % EX CREA
TOPICAL_CREAM | CUTANEOUS | Status: AC
  Filled 2016-11-08: qty 15

## 2016-11-08 MED ORDER — CIPROFLOXACIN HCL 500 MG PO TABS: 500.0000 mg | ORAL_TABLET | Freq: Two times a day (BID) | ORAL | 0 refills | Status: AC

## 2016-11-08 MED ORDER — PROPOFOL 10 MG/ML IV BOLUS
INTRAVENOUS | Status: DC | PRN
  Administered 2016-11-08: 260 mg via INTRAVENOUS

## 2016-11-08 MED ORDER — OXYMETAZOLINE HCL 0.05 % NA SOLN
NASAL | Status: DC | PRN
  Administered 2016-11-08: 1 via TOPICAL

## 2016-11-08 MED ORDER — LIDOCAINE 2% (20 MG/ML) 5 ML SYRINGE
INTRAMUSCULAR | Status: AC
  Filled 2016-11-08: qty 5

## 2016-11-08 MED ORDER — LACTATED RINGERS IV SOLN: INTRAVENOUS | Status: DC

## 2016-11-08 MED ORDER — PROPOFOL 500 MG/50ML IV EMUL
INTRAVENOUS | Status: AC
  Filled 2016-11-08: qty 50

## 2016-11-08 MED ORDER — ALBUTEROL SULFATE HFA 108 (90 BASE) MCG/ACT IN AERS
INHALATION_SPRAY | RESPIRATORY_TRACT | Status: AC
  Filled 2016-11-08: qty 6.7

## 2016-11-08 MED ORDER — 0.9 % SODIUM CHLORIDE (POUR BTL) OPTIME
TOPICAL | Status: DC | PRN
  Administered 2016-11-08: 1000 mL

## 2016-11-08 MED ORDER — CIPROFLOXACIN-DEXAMETHASONE 0.3-0.1 % OT SUSP
OTIC | Status: AC
  Filled 2016-11-08: qty 7.5

## 2016-11-08 MED ORDER — LIDOCAINE-EPINEPHRINE (PF) 1 %-1:200000 IJ SOLN
INTRAMUSCULAR | Status: AC
  Filled 2016-11-08: qty 30

## 2016-11-08 MED ORDER — MIDAZOLAM HCL 5 MG/5ML IJ SOLN
INTRAMUSCULAR | Status: DC | PRN
  Administered 2016-11-08: 2 mg via INTRAVENOUS

## 2016-11-08 MED ORDER — OXYMETAZOLINE HCL 0.05 % NA SOLN
NASAL | Status: AC
  Filled 2016-11-08: qty 15

## 2016-11-08 MED ORDER — HYDROMORPHONE HCL 1 MG/ML IJ SOLN
INTRAMUSCULAR | Status: AC
  Filled 2016-11-08: qty 0.5

## 2016-11-08 MED ORDER — CHLORHEXIDINE GLUCONATE CLOTH 2 % EX PADS: 6.0000 | MEDICATED_PAD | Freq: Once | CUTANEOUS | Status: DC

## 2016-11-08 MED ORDER — PHENYLEPHRINE 40 MCG/ML (10ML) SYRINGE FOR IV PUSH (FOR BLOOD PRESSURE SUPPORT)
PREFILLED_SYRINGE | INTRAVENOUS | Status: AC
  Filled 2016-11-08: qty 10

## 2016-11-08 MED ORDER — LIDOCAINE-EPINEPHRINE (PF) 1 %-1:200000 IJ SOLN
INTRAMUSCULAR | Status: DC | PRN
  Administered 2016-11-08: 3 mL

## 2016-11-08 MED ORDER — FENTANYL CITRATE (PF) 100 MCG/2ML IJ SOLN
INTRAMUSCULAR | Status: DC | PRN
  Administered 2016-11-08: 50 ug via INTRAVENOUS

## 2016-11-08 MED ORDER — FENTANYL CITRATE (PF) 100 MCG/2ML IJ SOLN
INTRAMUSCULAR | Status: AC
  Filled 2016-11-08: qty 2

## 2016-11-08 MED ORDER — DEXAMETHASONE SODIUM PHOSPHATE 10 MG/ML IJ SOLN
INTRAMUSCULAR | Status: DC | PRN
  Administered 2016-11-08: 10 mg via INTRAVENOUS

## 2016-11-08 MED ORDER — ONDANSETRON HCL 4 MG/2ML IJ SOLN
INTRAMUSCULAR | Status: AC
  Filled 2016-11-08: qty 2

## 2016-11-08 MED ORDER — LIDOCAINE HCL (CARDIAC) 20 MG/ML IV SOLN
INTRAVENOUS | Status: DC | PRN
  Administered 2016-11-08: 50 mg via INTRAVENOUS

## 2016-11-08 MED ORDER — HYDROCODONE-ACETAMINOPHEN 5-325 MG PO TABS: 1.0000 | ORAL_TABLET | Freq: Four times a day (QID) | ORAL | 0 refills | Status: AC | PRN

## 2016-11-08 MED ORDER — DEXAMETHASONE SODIUM PHOSPHATE 10 MG/ML IJ SOLN
INTRAMUSCULAR | Status: AC
  Filled 2016-11-08: qty 1

## 2016-11-08 MED ORDER — MIDAZOLAM HCL 2 MG/2ML IJ SOLN
INTRAMUSCULAR | Status: AC
Start: 2016-11-08 — End: 2016-11-08
  Filled 2016-11-08: qty 2

## 2016-11-08 MED ORDER — LACTATED RINGERS IV SOLN
INTRAVENOUS | Status: DC
  Administered 2016-11-08 (×3): via INTRAVENOUS

## 2016-11-08 MED ORDER — CIPROFLOXACIN-DEXAMETHASONE 0.3-0.1 % OT SUSP
OTIC | Status: DC | PRN
  Administered 2016-11-08: 1 [drp] via OTIC

## 2016-11-08 MED ORDER — ONDANSETRON HCL 4 MG/2ML IJ SOLN
INTRAMUSCULAR | Status: DC | PRN
  Administered 2016-11-08: 4 mg via INTRAVENOUS

## 2016-11-08 SURGICAL SUPPLY — 30 items
BALL CTTN LRG ABS STRL LF (GAUZE/BANDAGES/DRESSINGS) ×2
BLADE MYRINGOTOMY 6 SPEAR HDL (BLADE) ×3 IMPLANT
BLADE MYRINGOTOMY 6" SPEAR HDL (BLADE) ×1
BLADE SURG 15 STRL LF DISP TIS (BLADE) IMPLANT
BLADE SURG 15 STRL SS (BLADE)
CANISTER SUCTION 2500CC (MISCELLANEOUS) ×4 IMPLANT
CONT SPEC 4OZ CLIKSEAL STRL BL (MISCELLANEOUS) ×4 IMPLANT
COTTONBALL LRG STERILE PKG (GAUZE/BANDAGES/DRESSINGS) ×4 IMPLANT
DRAPE PROXIMA HALF (DRAPES) IMPLANT
GAUZE SPONGE 2X2 8PLY STRL LF (GAUZE/BANDAGES/DRESSINGS) IMPLANT
GLOVE BIOGEL M 7.0 STRL (GLOVE) ×4 IMPLANT
KIT ROOM TURNOVER OR (KITS) ×4 IMPLANT
MARKER SKIN DUAL TIP RULER LAB (MISCELLANEOUS) ×4 IMPLANT
NDL HYPO 25GX1X1/2 BEV (NEEDLE) IMPLANT
NEEDLE HYPO 25GX1X1/2 BEV (NEEDLE) IMPLANT
NS IRRIG 1000ML POUR BTL (IV SOLUTION) ×4 IMPLANT
PACK EENT II TURBAN DRAPE (CUSTOM PROCEDURE TRAY) ×2 IMPLANT
PAD ARMBOARD 7.5X6 YLW CONV (MISCELLANEOUS) ×4 IMPLANT
PROS SHEEHY TY XOMED (OTOLOGIC RELATED)
SPONGE GAUZE 2X2 STER 10/PKG (GAUZE/BANDAGES/DRESSINGS) ×2
SYR BULB 3OZ (MISCELLANEOUS) IMPLANT
TOWEL OR 17X24 6PK STRL BLUE (TOWEL DISPOSABLE) ×4 IMPLANT
TUBE CONNECTING 12'X1/4 (SUCTIONS) ×1
TUBE CONNECTING 12X1/4 (SUCTIONS) ×3 IMPLANT
TUBE EAR ARMSTRONG FL 1.14X3.5 (OTOLOGIC RELATED) IMPLANT
TUBE EAR SHEEHY BUTTON 1.27 (OTOLOGIC RELATED) IMPLANT
TUBE EAR T MOD 1.32X4.8 BL (OTOLOGIC RELATED) ×2 IMPLANT
TUBE EAR VENT PAPARELLA 1.02MM (OTOLOGIC RELATED) IMPLANT
TUBE T ENT MOD 1.32X4.8 BL (OTOLOGIC RELATED) ×2
TUBING EXTENTION W/L.L. (IV SETS) IMPLANT

## 2016-11-08 NOTE — H&P (Signed)
Rita OharaChristian L Christena FlakeSartin is an 21 y.o. male.   Chief Complaint: Chronic SOME and nasal obstruction HPI: Chronic SOME and nasal obstruction  Past Medical History:  Diagnosis Date  . Anxiety   . Bipolar 1 disorder (HCC)   . Depression   . GERD (gastroesophageal reflux disease)   . Hearing difficulty   . Hiatal hernia   . Hiatal hernia   . Hypertension   . Obesity   . Vision abnormalities    near sited    Past Surgical History:  Procedure Laterality Date  . ADENOIDECTOMY    . TOOTH EXTRACTION N/A 01/31/2014   Procedure: EXTRACTION 3rd MOLARS;  Surgeon: Georgia LopesScott M Jensen, DDS;  Location: [facility] OR;  Service: Oral Surgery;  Laterality: N/A;  . TYMPANOSTOMY TUBE PLACEMENT     sev    Family History  Problem Relation Age of Onset  . Depression Mother   . Mental illness Mother   . Varicose Veins Mother   . Asthma Sister   . Learning disabilities Sister   . Arthritis Maternal Grandmother   . Cancer Maternal Grandmother   . COPD Maternal Grandmother   . Depression Maternal Grandmother   . Heart disease Maternal Grandmother   . Hyperlipidemia Maternal Grandmother   . Hypertension Maternal Grandmother   . Arthritis Maternal Grandfather   . COPD Maternal Grandfather   . Diabetes Maternal Grandfather   . Hyperlipidemia Maternal Grandfather   . Hypertension Maternal Grandfather   . Arthritis Paternal Grandmother   . Arthritis Paternal Grandfather    Social History:  reports that he has never smoked. He has never used smokeless tobacco. He reports that he does not drink alcohol or use drugs.  Allergies:  Allergies  Allergen Reactions  . Penicillins Anaphylaxis and Swelling    Medications Prior to Admission  Medication Sig Dispense Refill  . FLUoxetine (PROZAC) 40 MG capsule Take 1 capsule (40 mg total) by mouth daily. 90 capsule 3  . fluticasone (FLONASE) 50 MCG/ACT nasal spray Place 2 sprays into both nostrils daily. (Patient taking differently: Place 2 sprays into both nostrils daily  as needed for allergies. ) 16 g 1  . hydrochlorothiazide (HYDRODIURIL) 25 MG tablet Take 1 tablet (25 mg total) by mouth daily. 90 tablet 3  . metoprolol succinate (TOPROL-XL) 50 MG 24 hr tablet Take 1 tablet (50 mg total) by mouth daily. Take with or immediately following a meal. 90 tablet 3  . omeprazole (PRILOSEC) 40 MG capsule Take 1 capsule (40 mg total) by mouth daily. 90 capsule 3  . oxymetazoline (AFRIN NASAL SPRAY) 0.05 % nasal spray Place 1 spray into both nostrils 2 (two) times daily. (Patient taking differently: Place 1 spray into both nostrils 2 (two) times daily as needed for congestion. ) 30 mL 0    No results found for this or any previous visit (from the past 48 hour(s)). No results found.  Review of Systems  Constitutional: Negative.   HENT: Positive for congestion and hearing loss.     Blood pressure 134/72, pulse (!) 112, temperature 98.4 F (36.9 C), temperature source Oral, resp. rate 20, SpO2 99 %. Physical Exam  Constitutional: He appears well-developed and well-nourished.  HENT:  Chronic SOME Turbinate hypertrophy  Neck: Normal range of motion. Neck supple.  Cardiovascular: Normal rate.   Respiratory: Effort normal.     Assessment/Plan Adm for OP BM&T and IT reduction  Olaf Mesa, MD 11/08/2016, 8:03 AM

## 2016-11-08 NOTE — Anesthesia Preprocedure Evaluation (Signed)
Anesthesia Evaluation  Patient identified by MRN, date of birth, ID band  Reviewed: Allergy & Precautions, NPO status , Patient's Chart, lab work & pertinent test results  Airway Mallampati: II  TM Distance: >3 FB     Dental   Pulmonary sleep apnea ,    breath sounds clear to auscultation       Cardiovascular hypertension,  Rhythm:Regular Rate:Normal     Neuro/Psych  Headaches,  Neuromuscular disease    GI/Hepatic hiatal hernia, GERD  ,(+) Hepatitis -  Endo/Other  negative endocrine ROS  Renal/GU negative Renal ROS     Musculoskeletal   Abdominal   Peds  Hematology   Anesthesia Other Findings   Reproductive/Obstetrics                             Anesthesia Physical Anesthesia Plan  ASA: III  Anesthesia Plan: General   Post-op Pain Management:    Induction: Intravenous  Airway Management Planned: Oral ETT  Additional Equipment:   Intra-op Plan:   Post-operative Plan: Possible Post-op intubation/ventilation  Informed Consent: I have reviewed the patients History and Physical, chart, labs and discussed the procedure including the risks, benefits and alternatives for the proposed anesthesia with the patient or authorized representative who has indicated his/her understanding and acceptance.   Dental advisory given  Plan Discussed with: CRNA and Anesthesiologist  Anesthesia Plan Comments:         Anesthesia Quick Evaluation

## 2016-11-08 NOTE — Brief Op Note (Signed)
11/08/2016  10:39 AM  PATIENT:  Maxwell Rose  21 y.o. male  PRE-OPERATIVE DIAGNOSIS:  CHRONIC OTITIS MEDIA     INFERIOR NASAL TURBINATE HYPERTROPHY  POST-OPERATIVE DIAGNOSIS:  CHRONIC OTITIS MEDIA     INFERIOR NASAL TURBINATE HYPERTROPHY  PROCEDURE:  Procedure(s): BILATERAL MYRINGOTOMY WITH TUBE PLACEMENT (Bilateral) BILATERAL TURBINATE REDUCTION (N/A)  SURGEON:  Surgeon(s) and Role:    * Osborn Cohoavid Lynnie Koehler, MD - Primary  PHYSICIAN ASSISTANT:   ASSISTANTS: none   ANESTHESIA:   general  EBL:  No intake/output data recorded.  BLOOD ADMINISTERED:none  DRAINS: none   LOCAL MEDICATIONS USED:  LIDOCAINE  and Amount: 3 ml  SPECIMEN:  No Specimen  DISPOSITION OF SPECIMEN:  N/A  COUNTS:  YES  TOURNIQUET:  * No tourniquets in log *  DICTATION: .Other Dictation: Dictation Number [id number]  PLAN OF CARE: Discharge to home after PACU  PATIENT DISPOSITION:  PACU - hemodynamically stable.   Delay start of Pharmacological VTE agent (>24hrs) due to surgical blood loss or risk of bleeding: not applicable

## 2016-11-08 NOTE — Anesthesia Postprocedure Evaluation (Signed)
Anesthesia Post Note  Patient: Maxwell Rose  Procedure(s) Performed: Procedure(s) (LRB): BILATERAL MYRINGOTOMY WITH TUBE PLACEMENT (Bilateral) BILATERAL TURBINATE REDUCTION (N/A)  Patient location during evaluation: PACU Anesthesia Type: General Level of consciousness: awake Pain management: pain level controlled Respiratory status: spontaneous breathing Cardiovascular status: stable Anesthetic complications: no       Last Vitals:  Vitals:   11/08/16 1200 11/08/16 1215  BP: 114/70 117/81  Pulse: 100 100  Resp: 14 19  Temp:      Last Pain:  Vitals:   11/08/16 1225  TempSrc:   PainSc: 4                  Jennifer Payes

## 2016-11-08 NOTE — Transfer of Care (Signed)
Immediate Anesthesia Transfer of Care Note  Patient: Maxwell Rose  Procedure(s) Performed: Procedure(s): BILATERAL MYRINGOTOMY WITH TUBE PLACEMENT (Bilateral) BILATERAL TURBINATE REDUCTION (N/A)  Patient Location: PACU  Anesthesia Type:General  Level of Consciousness: awake, alert  and oriented  Airway & Oxygen Therapy: Patient Spontanous Breathing and Patient connected to face mask oxygen  Post-op Assessment: Report given to RN and Post -op Vital signs reviewed and stable  Post vital signs: Reviewed  Last Vitals: 133/81, 95. 24, 100% Vitals:   11/08/16 1100 11/08/16 1105  BP:  133/74  Pulse:  (!) 112  Resp:  (!) 21  Temp: (P) 36.4 C     Last Pain:  Vitals:   11/08/16 0755  TempSrc: Oral         Complications: No apparent anesthesia complications

## 2016-11-08 NOTE — Anesthesia Procedure Notes (Addendum)
Procedure Name: Intubation Date/Time: 11/08/2016 10:02 AM Performed by: Myra RudeUTTLE, Megin Consalvo ANN Pre-anesthesia Checklist: Patient identified, Suction available, Emergency Drugs available, Patient being monitored and Timeout performed Patient Re-evaluated:Patient Re-evaluated prior to inductionOxygen Delivery Method: Circle system utilized Preoxygenation: Pre-oxygenation with 100% oxygen Intubation Type: IV induction Ventilation: Two handed mask ventilation required and Oral airway inserted - appropriate to patient size Laryngoscope Size: Glidescope and 4 Grade View: Grade I Number of attempts: 2 Airway Equipment and Method: Stylet and Video-laryngoscopy Placement Confirmation: ETT inserted through vocal cords under direct vision,  positive ETCO2 and breath sounds checked- equal and bilateral Secured at: 24 cm Tube secured with: Tape Dental Injury: Injury to tongue and Teeth and Oropharynx as per pre-operative assessment  Difficulty Due To: Difficult Airway- due to large tongue Future Recommendations: Recommend- induction with short-acting agent, and alternative techniques readily available Comments: First look with glidescope, cords not visualized. OPA inserted, manual ventilation, good air exchange. Second look visualized, ETT placed with +ETCO2, +BBS, administered albuterol via ETT.

## 2016-11-09 ENCOUNTER — Encounter (HOSPITAL_COMMUNITY): Payer: Self-pay | Admitting: Otolaryngology

## 2016-11-11 NOTE — Op Note (Signed)
NAME:  Maxwell Rose, Maxwell Rose            ACCOUNT NO.:  [id number]  MEDICAL RECORD NO.:  [id number]  LOCATION:                                 FACILITY:  PHYSICIAN:  Kinnie Scales. Annalee Genta, M.D.DATE OF BIRTH:  June 03, 1996  DATE OF PROCEDURE:  11/08/2016 DATE OF DISCHARGE:                              OPERATIVE REPORT   LOCATION:  [facility] Main OR.  PREOPERATIVE DIAGNOSES: 1. Chronic serous otitis media. 2. 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 3. Morbid obesity.  POSTOPERATIVE DIAGNOSES: 1. Chronic serous otitis media. 2. 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 3. Morbid obesity.  SURGICAL PROCEDURE: 1. Bilateral myringotomy and T-tube placement. 2. Bilateral inferior turbinate reduction.  ANESTHESIA:  General endotracheal.  COMPLICATIONS:  None.  ESTIMATED BLOOD LOSS:  Less than 50 mL.  DISPOSITION:  The patient transferred from the operating room to the recovery room in stable condition.  BRIEF HISTORY:  The patient is a 21 year old male, who was referred to our office for evaluation of hearing loss and chronic serous otitis media.  The patient has had a history of recurrent acute otitis media, no recent infection, but bilateral middle ear effusion and conductive hearing loss.  The patient also had significant nasal airway obstruction and nasal congestion from turbinate hypertrophy.  Given his history and examination, I recommended the above surgical procedures.  The risks and benefits were discussed in detail with the patient, who understood and agreed with our plan for surgery, which was scheduled on elective outpatient basis at [facility] Main OR.  The patient is morbidly obese and his surgical procedure was scheduled in the main OR for safety concerns.  DESCRIPTION OF PROCEDURE:  The patient was brought to the operating room on 11/08/2016 and placed in supine position on the  operating table. General endotracheal anesthesia was established without difficulty. When the patient was adequately anesthetized, he was positioned and prepped and draped.  A surgical time-out was then performed with correct identification of the patient and the surgical procedures.  With the patient prepped, draped, and prepared, the patient's right ear was examined using the operating microscope.  The ear canal was cleared of cerumen.  An anterior-inferior myringotomy was performed.  A T tympanostomy tube was then inserted without difficulty and Ciprodex drops were instilled in the ear canal.  The patient's left ear was then examined with the operating microscope.  Ear canal cleared of cerumen. Anterior-inferior myringotomy performed.  Mucoid middle ear effusion was aspirated.  A T tympanostomy tube was inserted without difficulty and Ciprodex drops were instilled in the ear canal.  The patient's nasal cavity was then inspected and he was injected with 3 mL of 1% lidocaine, 1:200,000 dilution epinephrine, which was injected in a submucosal fashion in the inferior turbinates bilaterally.  The patient's nose was then packed with Afrin-soaked cottonoid pledgets that were left in place for approximately 10 minutes to allow for vasoconstriction and hemostasis.  The procedure was then begun with bipolar intramural cautery set at 12 watts.  Two submucosal passes were made in each inferior turbinate.  When the turbinates had been adequately cauterized, anterior incisions were  created, overlying soft tissue elevated, and a small amount of turbinate bone was resected.  The turbinates were then outfractured bilaterally to create a more patent nasal cavity.  The patient's nasal cavity was irrigated and suctioned.  An orogastric tube was passed.  Stomach contents were aspirated.  The patient was then awakened from his anesthetic.  He was extubated and transferred from the operating room to the  recovery room in stable condition.  There were no complications and estimated blood loss was less than 50 mL.    ______________________________ Kinnie Scalesavid L. Annalee GentaShoemaker, M.D.   ______________________________ Kinnie Scalesavid L. Annalee GentaShoemaker, M.D.    DLS/MEDQ  D:  29/52/[id number]/19/2018  T:  11/09/2016  Job:  [id number]

## 2017-05-15 ENCOUNTER — Telehealth: Payer: Self-pay | Admitting: Family Medicine

## 2017-05-15 DIAGNOSIS — H9209 Otalgia, unspecified ear: Secondary | ICD-10-CM

## 2017-05-15 NOTE — Telephone Encounter (Signed)
Pt had ear surgery back in January and has been up all night with ear issues. Dr. Annalee GentaShoemaker wants to see pt today @ 2:50pm, they need a new referral sent over before appointment. ep

## 2017-08-24 ENCOUNTER — Emergency Department (HOSPITAL_COMMUNITY): Payer: Medicaid Other

## 2017-08-24 ENCOUNTER — Other Ambulatory Visit: Payer: Self-pay

## 2017-08-24 ENCOUNTER — Encounter (HOSPITAL_COMMUNITY): Payer: Self-pay | Admitting: Emergency Medicine

## 2017-08-24 ENCOUNTER — Emergency Department (HOSPITAL_COMMUNITY)
Admission: EM | Admit: 2017-08-24 | Discharge: 2017-08-24 | Disposition: A | Discharge status: Home / Self Care | Payer: Medicaid Other | Attending: Emergency Medicine | Admitting: Emergency Medicine

## 2017-08-24 DIAGNOSIS — I10 Essential (primary) hypertension: Secondary | ICD-10-CM | POA: Diagnosis not present

## 2017-08-24 DIAGNOSIS — Z79899 Other long term (current) drug therapy: Secondary | ICD-10-CM | POA: Insufficient documentation

## 2017-08-24 DIAGNOSIS — J0140 Acute pansinusitis, unspecified: Secondary | ICD-10-CM | POA: Diagnosis not present

## 2017-08-24 DIAGNOSIS — R509 Fever, unspecified: Secondary | ICD-10-CM | POA: Diagnosis present

## 2017-08-24 LAB — COMPREHENSIVE METABOLIC PANEL
ALBUMIN: 3.7 g/dL (ref 3.5–5.0)
ALT: 53 U/L (ref 17–63)
ANION GAP: 10 (ref 5–15)
AST: 28 U/L (ref 15–41)
Alkaline Phosphatase: 47 U/L (ref 38–126)
BILIRUBIN TOTAL: 0.7 mg/dL (ref 0.3–1.2)
BUN: 12 mg/dL (ref 6–20)
CO2: 25 mmol/L (ref 22–32)
Calcium: 9.4 mg/dL (ref 8.9–10.3)
Chloride: 102 mmol/L (ref 101–111)
Creatinine, Ser: 1.02 mg/dL (ref 0.61–1.24)
GFR calc Af Amer: >60 mL/min (ref >60)
GFR calc non Af Amer: >60 mL/min (ref >60)
GLUCOSE: 107 mg/dL — AB (ref 65–99)
POTASSIUM: 4 mmol/L (ref 3.5–5.1)
SODIUM: 137 mmol/L (ref 135–145)
TOTAL PROTEIN: 7.5 g/dL (ref 6.5–8.1)

## 2017-08-24 LAB — CBC
HCT: 42.3 % (ref 39.0–52.0)
Hemoglobin: 14.5 g/dL (ref 13.0–17.0)
MCH: 28 pg (ref 26.0–34.0)
MCHC: 34.3 g/dL (ref 30.0–36.0)
MCV: 81.7 fL (ref 78.0–100.0)
Platelets: 305 10*3/uL (ref 150–400)
RBC: 5.18 MIL/uL (ref 4.22–5.81)
RDW: 13.1 % (ref 11.5–15.5)
WBC: 10.7 10*3/uL — ABNORMAL HIGH (ref 4.0–10.5)

## 2017-08-24 LAB — I-STAT CG4 LACTIC ACID, ED: Lactic Acid, Venous: 1.03 mmol/L (ref 0.5–1.9)

## 2017-08-24 MED ORDER — ONDANSETRON 4 MG PO TBDP: ORAL_TABLET | ORAL | 0 refills | Status: DC

## 2017-08-24 MED ORDER — IOPAMIDOL (ISOVUE-300) INJECTION 61%
100.0000 mL | Freq: Once | INTRAVENOUS | Status: AC | PRN
  Administered 2017-08-24: 100 mL via INTRAVENOUS

## 2017-08-24 MED ORDER — DOXYCYCLINE HYCLATE 100 MG PO CAPS: 100.0000 mg | ORAL_CAPSULE | Freq: Two times a day (BID) | ORAL | 0 refills | Status: DC

## 2017-08-24 MED ORDER — IOPAMIDOL (ISOVUE-300) INJECTION 61%
INTRAVENOUS | Status: AC
  Filled 2017-08-24: qty 100

## 2017-08-24 MED ORDER — SODIUM CHLORIDE 0.9 % IV BOLUS (SEPSIS)
1000.0000 mL | Freq: Once | INTRAVENOUS | Status: AC
  Administered 2017-08-24: 1000 mL via INTRAVENOUS

## 2017-08-24 NOTE — ED Triage Notes (Signed)
Pt family reports pt has been sick for the last week and has been having vomiting and fever. Pt was given Tylenol and Aleve appx 1 hr prior to arrival.

## 2017-08-24 NOTE — Discharge Instructions (Signed)
Please complete full course of antibiotics, and use Sudafed decongestant as well.  You may use Zofran as needed for nausea.  Please schedule an appointment to follow-up with Dr. Annalee GentaShoemaker with ENT next week.  If symptoms are not improving despite this, you are having worsening fevers, headaches, neck stiffness, shortness of breath or difficulty breathing please return to the ED for reevaluation.  Please follow-up with primary doctor as well.

## 2017-08-24 NOTE — ED Notes (Signed)
Pt has been having sinus congestion, headache, non-productive cough, and emesis >10 times onset Wednesday. Per pt's mother she has tried theraflu, cold & allergy, tylenol sinus & congestion, ny-quil. He said he had a sore throat and diarrhea, but not currently.

## 2017-08-24 NOTE — ED Provider Notes (Signed)
[facility]-EMERGENCY DEPT Provider Note   CSN: [id number] Arrival date & time: 08/24/17  0865     History   Chief Complaint Chief Complaint  Patient presents with  . Emesis  . Fever    HPI  Maxwell Rose is a 21 y.o. Male with a history of morbid obesity, depression, GERD, hypertension, who presents complaining of nasal congestion, cough, and subjective fevers and chills since Wednesday.  Patient reports he woke up this morning and felt like he was having difficulty breathing with the symptoms, mom gave him a dose of Tylenol cold and sinus, as well as some Aleve just prior to arrival.  Patient reports cough is often productive of green mucus, and he has had lots of green nasal drainage as well.  Patient complaining of lots of sinus pressure with associated headache. Pt's mother reports he had nasal surgery earlier this year and since then he has had lots of trouble with his sinuses. He reports with this he has had several episodes of emesis, often posttussive.  Patient denies associated nausea or abdominal pain.  He does report when symptoms first started he had an episode of diarrhea with some blood present, and reports since then he has had no further bloody bowel movements. Pt denies chest pain, or abdominal pain, no neck stiffness or pain.       Past Medical History:  Diagnosis Date  . Anxiety   . Bipolar 1 disorder (HCC)   . Depression   . GERD (gastroesophageal reflux disease)   . Hearing difficulty   . Hiatal hernia   . Hiatal hernia   . Hypertension   . Obesity   . Vision abnormalities    near sited    Patient Active Problem List   Diagnosis Date Noted  . Ear pain 05/15/2017  . Otitis media 11/08/2016  . Nasal turbinate hypertrophy 11/08/2016  . Viral gastroenteritis 11/04/2016  . Abrasion of right arm, initial encounter 10/31/2016  . 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 Hearing loss associated with syndrome of both  ears 01/19/2014  . Morbid obesity with body mass index of 50 or higher (HCC) 06/29/2012  . Hypertension 12/06/2010  . Sleep apnea 10/26/2010  . 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 Severe major depression without psychotic features (HCC) 07/30/2010  . GERD 10/03/2009  . NASH (nonalcoholic steatohepatitis) 05/31/2009    Past Surgical History:  Procedure Laterality Date  . ADENOIDECTOMY    . TYMPANOSTOMY TUBE PLACEMENT     sev       Home Medications    Prior to Admission medications   Medication Sig Start Date End Date Taking? Authorizing Provider  dextromethorphan-guaiFENesin (MUCINEX DM) 30-600 MG 12hr tablet Take 1 tablet 2 (two) times daily as needed by mouth for cough.   Yes [provider]  naproxen (NAPROSYN) 500 MG tablet Take 500 mg 2 (two) times daily as needed by mouth for mild pain.   Yes [provider]  omeprazole (PRILOSEC) 40 MG capsule Take 1 capsule (40 mg total) by mouth daily. Patient taking differently: Take 40 mg 2 (two) times daily by mouth.  10/31/16  Yes Hensel, Santiago Bumpers, MD  oxymetazoline (AFRIN NASAL SPRAY) 0.05 % nasal spray Place 1 spray into both nostrils 2 (two) times daily. Patient taking differently: Place 1 spray into both nostrils 2 (two) times daily as needed for congestion.  08/15/16  Yes Ardith Dark, MD  Phenylephrine-Pheniramine-DM [facility] COLD & COUGH PO)  Take 1 packet every 12 (twelve) hours as needed by mouth (cold symptoms).   Yes [provider]  Pseudoeph-Doxylamine-DM-APAP (NYQUIL PO) Take 15 mLs every 6 (six) hours as needed by mouth (cold symptoms).   Yes [provider]  pseudoephedrine-acetaminophen (TYLENOL SINUS) 30-500 MG TABS tablet Take 1 tablet every 4 (four) hours as needed by mouth (cold symptoms).   Yes [provider]  triprolidine-pseudoephedrine (APRODINE) 2.5-60 MG TABS tablet Take 1 tablet every 6 (six) hours as needed by mouth for allergies or  congestion.   Yes [provider]  FLUoxetine (PROZAC) 40 MG capsule Take 1 capsule (40 mg total) by mouth daily. Patient not taking: Reported on 08/24/2017 04/21/15   Moses Manners, MD  hydrochlorothiazide (HYDRODIURIL) 25 MG tablet Take 1 tablet (25 mg total) by mouth daily. Patient not taking: Reported on 08/24/2017 04/21/15   Moses Manners, MD  metoprolol succinate (TOPROL-XL) 50 MG 24 hr tablet Take 1 tablet (50 mg total) by mouth daily. Take with or immediately following a meal. Patient not taking: Reported on 08/24/2017 04/21/15   Moses Manners, MD    Family History Family History  Problem Relation Age of Onset  . Depression Mother   . Mental illness Mother   . Varicose Veins Mother   . Asthma Sister   . Learning disabilities Sister   . Arthritis Maternal Grandmother   . Cancer Maternal Grandmother   . COPD Maternal Grandmother   . Depression Maternal Grandmother   . Heart disease Maternal Grandmother   . Hyperlipidemia Maternal Grandmother   . Hypertension Maternal Grandmother   . Arthritis Maternal Grandfather   . COPD Maternal Grandfather   . Diabetes Maternal Grandfather   . Hyperlipidemia Maternal Grandfather   . Hypertension Maternal Grandfather   . Arthritis Paternal Grandmother   . Arthritis Paternal Grandfather     Social History Social History   Tobacco Use  . Smoking status: Never Smoker  . Smokeless tobacco: Never Used  Substance Use Topics  . Alcohol use: No  . Drug use: No     Allergies   Penicillins   Review of Systems Review of Systems  Constitutional: Positive for chills and fever.  HENT: Positive for congestion, postnasal drip, rhinorrhea, sinus pressure, sinus pain and sore throat. Negative for ear discharge, ear pain, trouble swallowing and voice change.   Eyes: Negative for photophobia, pain, discharge, itching and visual disturbance.  Respiratory: Positive for cough and shortness of breath. Negative for chest tightness and  wheezing.   Cardiovascular: Negative for chest pain and palpitations.  Gastrointestinal: Positive for vomiting. Negative for abdominal pain and nausea.  Genitourinary: Negative for dysuria.  Musculoskeletal: Positive for myalgias. Negative for back pain, neck pain and neck stiffness.  Skin: Negative for rash.  Neurological: Positive for headaches. Negative for weakness and numbness.     Physical Exam Updated Vital Signs BP (!) 137/100 (BP Location: Left Arm)   Pulse 97   Temp 98.3 F (36.8 C) (Oral) Comment: last had tylenol sinus and congestion at 06:00  Resp 15   Ht 5\' 7"  (1.702 m)   Wt (!) 181.4 kg (400 lb)   SpO2 98%   BMI 62.65 kg/m   Physical Exam  Constitutional: He appears well-developed and well-nourished. No distress.  Morbidly obese male  HENT:  Head: Normocephalic and atraumatic.  TMs clear with good landmarks, moderate nasal mucosa edema with green rhinorrhea, posterior oropharynx clear and moist, with some erythema, mild edema, no exudates, uvula  midline, no trismus. Frontal and maxillary sinuses very tender to palpation. Pt has very prominent frontal sinus   Eyes: Right eye exhibits no discharge. Left eye exhibits no discharge.  Neck: Normal range of motion. Neck supple.  Cardiovascular: Normal rate, regular rhythm, normal heart sounds and intact distal pulses.  Pulmonary/Chest: Effort normal and breath sounds normal. No stridor. No respiratory distress. He has no wheezes. He has no rales.  Exam limited by body habitus  Abdominal: Soft. Bowel sounds are normal. He exhibits no distension and no mass. There is no tenderness. There is no guarding.  Neurological: He is alert. Coordination normal.  Speech is clear, able to follow commands CN III-XII intact Normal strength in upper and lower extremities bilaterally including dorsiflexion and plantar flexion, strong and equal grip strength Sensation normal  Moves extremities without ataxia, coordination intact No  nuchal rigidity  Skin: Skin is warm and dry. Capillary refill takes less than 2 seconds. No rash noted. He is not diaphoretic.  Psychiatric: He has a normal mood and affect. His behavior is normal.  Nursing note and vitals reviewed.    ED Treatments / Results  Labs (all labs ordered are listed, but only abnormal results are displayed) Labs Reviewed  CBC - Abnormal; Notable for the following components:      Result Value   WBC 10.7 (*)    All other components within normal limits  COMPREHENSIVE METABOLIC PANEL - Abnormal; Notable for the following components:   Glucose, Bld 107 (*)    All other components within normal limits  I-STAT CG4 LACTIC ACID, ED    EKG  EKG Interpretation None       Radiology Dg Chest 2 View  Result Date: 08/24/2017 CLINICAL DATA:  21 year old male with productive cough and shortness of breath for 4 days with fever. EXAM: CHEST  2 VIEW COMPARISON:  10/24/2016 FINDINGS: The heart size and mediastinal contours are within normal limits. There are low lung volumes. Both lungs are clear. The visualized skeletal structures are unremarkable. IMPRESSION: No active cardiopulmonary disease. Electronically Signed   By: Sande Brothers M.D.   On: 08/24/2017 08:23   Ct Maxillofacial W & Wo Contrast  Result Date: 08/24/2017 CLINICAL DATA:  21 year old obese male with sinus congestion, cough, fever, headache, and forehead swelling. Clinical suspicion of complicated acute sinusitis - pots puffy tumor. EXAM: CT MAXILLOFACIAL WITHOUT AND WITH CONTRAST TECHNIQUE: Multidetector CT imaging of the maxillofacial structures was performed without and with intravenous contrast. Multiplanar CT image reconstructions were also generated. CONTRAST:  ISOVUE-300 IOPAMIDOL (ISOVUE-300) INJECTION 61% COMPARISON:  None. FINDINGS: Osseous: Mandible intact. Maxilla, zygoma, nasal bones, and central skullbase appear intact. Orbits: Orbital walls intact. Bilateral orbits soft tissues appear  normal. Sinuses: Bilateral tympanic cavities and mastoids are clear. Diffuse bilateral paranasal sinus mucosal thickening with intermittent superimposed bubbly opacity and fluid levels. There are fluid levels in both frontal sinuses, although there remains frontal sinus pneumatization bilaterally (better on the right). The right maxillary sinus is subtotally opacified. There is bubbly opacity throughout the nasal cavity greater on the left. There is leftward nasal septal deviation and spurring. No anterior frontal sinus wall bone defect. No permeative changes in the walls of the sinus. There is bilateral maxillary and sphenoid periosteal thickening. Soft tissues: No definite forehead soft tissue abnormality or enhancement. There is definitely no scalp fluid collection. Also, no retro maxillary fat stranding or other deep soft tissue face complications from the sinus disease. Negative visualized larynx, pharynx, parapharyngeal spaces, retropharyngeal  space, sublingual space, submandibular glands and parotid glands. There is perhaps mild adenoid and palatine tonsil hypertrophy. Bilateral level 2A lymph nodes are at the upper limits of normal. Other visible nodal stations are normal. Major vascular structures in the visible neck and at the skullbase are patent including both visible internal jugular veins. Limited intracranial: No intracranial mass effect is evident. Gray-white matter differentiation appears normal without evidence of frontal lobe or other visible cerebral edema. No abnormal intracranial enhancement identified. No dural thickening is evident. Visible major intracranial vascular structures appear to be normally enhancing. IMPRESSION: 1. Bilateral Acute Paranasal Sinusitis, with associated Acute Rhinitis, but no complicating features are identified. No evidence of pots puffy tumor. 2. Possible associated acute pharyngitis or tonsillitis, but there is mild if any reactive lymphadenopathy and no  complicating features identified. 3. Normal CT appearance of the visible brain. Electronically Signed   By: Odessa FlemingH  Hall M.D.   On: 08/24/2017 10:11    Procedures Procedures (including critical care time)  Medications Ordered in ED Medications  iopamidol (ISOVUE-300) 61 % injection (not administered)  sodium chloride 0.9 % bolus 1,000 mL (0 mLs Intravenous Stopped 08/24/17 1000)  iopamidol (ISOVUE-300) 61 % injection 100 mL (100 mLs Intravenous Contrast Given 08/24/17 0943)     Initial Impression / Assessment and Plan / ED Course  I have reviewed the triage vital signs and the nursing notes.  Pertinent labs & imaging results that were available during my care of the patient were reviewed by me and considered in my medical decision making (see chart for details).  Pt presents with 5 days of worsening cough, and nasal congestion with sinus pain and pressure. Pt is afebrile and vitals are normal, pt is somewhat ill-appearing and appears dry clinically. Will order labs, lactic acid and CXR and give fluids. Dr. Madilyn Hookees evaluated pt as well and is concerned for Pott's Puffy tumor, will get Maxillofacial CT as well.  Minimally elevated WBCs at 10.7, labs otherwise unremarkable, lactic normal. CXR shows no active cardiopulmonary disease. CT shows bilateral acute sinusitis with some associated pharyngitis, no complicating features, no evidence of potts puffy tumor.  On reevaluation pt reports he is feeling better. Pt able to tolerate PO fluids and crackers. Pt's SpO2 remains in the high 90s off of oxygen and pt appears much more active and alert. Pt stable for discharge home with doxycycline for treatment of sinusitis, decongestants, and zofran for nausea. Counseled pt on motrin and acetaminophen for pain and fevers. Pt to follow up with Dr. Annalee GentaShoemaker with ENT next week to ensure he is improving. Strict return precautions discussed. Pt and family express understanding and are in agreement with plan.  Patient  discussed with Dr. Madilyn Hookees, who saw patient as well and agrees with plan.  Final Clinical Impressions(s) / ED Diagnoses   Final diagnoses:  Acute pansinusitis, recurrence not specified    New Prescriptions This SmartLink is deprecated. Use AVSMEDLIST instead to display the medication list for a patient.   Dartha LodgeFord, Nilesh Stegall N, PA-C 08/24/17 2202    Tilden Fossaees, Elizabeth, MD 08/26/17 [phone]

## 2017-08-27 ENCOUNTER — Ambulatory Visit: Payer: Medicaid Other | Admitting: Family Medicine

## 2017-11-04 ENCOUNTER — Emergency Department (HOSPITAL_COMMUNITY)
Admission: EM | Admit: 2017-11-04 | Discharge: 2017-11-04 | Disposition: A | Discharge status: Home / Self Care | Payer: Medicaid Other | Attending: Emergency Medicine | Admitting: Emergency Medicine

## 2017-11-04 ENCOUNTER — Encounter (HOSPITAL_COMMUNITY): Payer: Self-pay

## 2017-11-04 DIAGNOSIS — J0111 Acute recurrent frontal sinusitis: Secondary | ICD-10-CM

## 2017-11-04 DIAGNOSIS — I10 Essential (primary) hypertension: Secondary | ICD-10-CM | POA: Insufficient documentation

## 2017-11-04 DIAGNOSIS — R519 Headache, unspecified: Secondary | ICD-10-CM

## 2017-11-04 DIAGNOSIS — R51 Headache: Secondary | ICD-10-CM

## 2017-11-04 DIAGNOSIS — J019 Acute sinusitis, unspecified: Secondary | ICD-10-CM | POA: Insufficient documentation

## 2017-11-04 MED ORDER — AZITHROMYCIN 250 MG PO TABS: 250.0000 mg | ORAL_TABLET | Freq: Every day | ORAL | 0 refills | Status: DC

## 2017-11-04 NOTE — ED Triage Notes (Signed)
Pt complains of a headache for two days

## 2017-11-04 NOTE — ED Provider Notes (Signed)
[facility]-EMERGENCY DEPT Provider Note   CSN: [id number] Arrival date & time: 11/04/17  0217     History   Chief Complaint Chief Complaint  Patient presents with  . Headache    HPI Maxwell Rose is a 22 y.o. male.  Patient here for evaluation of a gradual onset, frontal headache x 2 days associated with congestion, sinus pressure and ear pain. He reports a subjective fever, hot/cold chills, and muscle aches. No nausea, vomiting, neck pain. He has not taken anything for symptoms prior to arrival. He has a history of recurrent ear infections and reports having tympanostomy tubes placed bilaterally last summer. There has been drainage from bilateral ears that appears yellow as well as bleeding from left ear. No dizziness, nausea or falls.    The history is provided by the patient and a parent. No language interpreter was used.  Headache   Associated symptoms include a fever (Subjective). Pertinent negatives include no shortness of breath, no nausea and no vomiting.    Past Medical History:  Diagnosis Date  . Anxiety   . Bipolar 1 disorder (HCC)   . Depression   . GERD (gastroesophageal reflux disease)   . Hearing difficulty   . Hiatal hernia   . Hiatal hernia   . Hypertension   . Obesity   . Vision abnormalities    near sited    Patient Active Problem List   Diagnosis Date Noted  . Ear pain 05/15/2017  . Otitis media 11/08/2016  . Nasal turbinate hypertrophy 11/08/2016  . Viral gastroenteritis 11/04/2016  . Abrasion of right arm, initial encounter 10/31/2016  . 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 Morbid obesity with body mass index of 50 or higher (HCC) 06/29/2012  . Hypertension 12/06/2010  . Sleep apnea 10/26/2010  . 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 Severe major depression without psychotic features (HCC) 07/30/2010  . GERD  10/03/2009  . NASH (nonalcoholic steatohepatitis) 05/31/2009    Past Surgical History:  Procedure Laterality Date  . ADENOIDECTOMY    . MYRINGOTOMY WITH TUBE PLACEMENT Bilateral 11/08/2016   Procedure: BILATERAL MYRINGOTOMY WITH TUBE PLACEMENT;  Surgeon: Osborn Coho, MD;  Location: [facility] OR;  Service: ENT;  Laterality: Bilateral;  . TOOTH EXTRACTION N/A 01/31/2014   Procedure: EXTRACTION 3rd MOLARS;  Surgeon: Georgia Lopes, DDS;  Location: [facility] OR;  Service: Oral Surgery;  Laterality: N/A;  . TURBINATE REDUCTION N/A 11/08/2016   Procedure: BILATERAL TURBINATE REDUCTION;  Surgeon: Osborn Coho, MD;  Location: [facility] OR;  Service: ENT;  Laterality: N/A;  . TYMPANOSTOMY TUBE PLACEMENT     sev       Home Medications    Prior to Admission medications   Medication Sig Start Date End Date Taking? Authorizing Provider  doxycycline (VIBRAMYCIN) 100 MG capsule Take 1 capsule (100 mg total) 2 (two) times daily by mouth. One po bid x 7 days Patient not taking: Reported on 11/04/2017 08/24/17   Dartha Lodge, PA-C  FLUoxetine (PROZAC) 40 MG capsule Take 1 capsule (40 mg total) by mouth daily. Patient not taking: Reported on 08/24/2017 04/21/15   Moses Manners, MD  hydrochlorothiazide (HYDRODIURIL) 25 MG tablet Take 1 tablet (25 mg total) by mouth daily. Patient not taking: Reported on 08/24/2017 04/21/15   Moses Manners, MD  metoprolol succinate (TOPROL-XL) 50 MG 24 hr tablet Take 1 tablet (50 mg total) by mouth daily.  Take with or immediately following a meal. Patient not taking: Reported on 08/24/2017 04/21/15   Moses MannersHensel, William A, MD  omeprazole (PRILOSEC) 40 MG capsule Take 1 capsule (40 mg total) by mouth daily. Patient not taking: Reported on 11/04/2017 10/31/16   Moses MannersHensel, William A, MD  ondansetron (ZOFRAN ODT) 4 MG disintegrating tablet 4mg  ODT q4 hours prn nausea/vomit Patient not taking: Reported on 11/04/2017 08/24/17   Dartha LodgeFord, Kelsey N, PA-C  oxymetazoline (AFRIN NASAL SPRAY) 0.05 % nasal spray  Place 1 spray into both nostrils 2 (two) times daily. Patient not taking: Reported on 11/04/2017 08/15/16   Ardith DarkParker, Caleb M, MD    Family History Family History  Problem Relation Age of Onset  . Depression Mother   . Mental illness Mother   . Varicose Veins Mother   . Asthma Sister   . Learning disabilities Sister   . Arthritis Maternal Grandmother   . Cancer Maternal Grandmother   . COPD Maternal Grandmother   . Depression Maternal Grandmother   . Heart disease Maternal Grandmother   . Hyperlipidemia Maternal Grandmother   . Hypertension Maternal Grandmother   . Arthritis Maternal Grandfather   . COPD Maternal Grandfather   . Diabetes Maternal Grandfather   . Hyperlipidemia Maternal Grandfather   . Hypertension Maternal Grandfather   . Arthritis Paternal Grandmother   . Arthritis Paternal Grandfather     Social History Social History   Tobacco Use  . Smoking status: Never Smoker  . Smokeless tobacco: Never Used  Substance Use Topics  . Alcohol use: No  . Drug use: No     Allergies   Penicillins   Review of Systems Review of Systems  Constitutional: Positive for chills and fever (Subjective).  HENT: Positive for congestion, ear discharge, ear pain, sinus pressure, sinus pain and sneezing. Negative for facial swelling and sore throat.   Respiratory: Positive for cough. Negative for shortness of breath.   Cardiovascular: Negative.  Negative for chest pain.  Gastrointestinal: Negative.  Negative for abdominal pain, nausea and vomiting.  Musculoskeletal: Negative.  Negative for myalgias.  Skin: Negative.   Neurological: Positive for headaches.     Physical Exam Updated Vital Signs BP (!) 146/102 (BP Location: Left Arm)   Pulse 100   Temp 99.4 F (37.4 C) (Oral)   Resp 20   SpO2 98%   Physical Exam  Constitutional: He is oriented to person, place, and time. He appears well-developed and well-nourished.  HENT:  Head: Normocephalic.  Nose: Mucosal edema  present. Right sinus exhibits maxillary sinus tenderness and frontal sinus tenderness. Left sinus exhibits maxillary sinus tenderness and frontal sinus tenderness.  Mouth/Throat: Oropharynx is clear and moist.  Tympanostomy tubes in place bilaterally. No hemotympanum visualized. No pooling of blood or discharge in external canals. There is membrane scarring present.   Neck: Normal range of motion. Neck supple.  Cardiovascular: Normal rate and regular rhythm.  Pulmonary/Chest: Effort normal and breath sounds normal. He has no wheezes. He has no rales.  Abdominal: Soft. Bowel sounds are normal. There is no tenderness. There is no rebound and no guarding.  Musculoskeletal: Normal range of motion.  Neurological: He is alert and oriented to person, place, and time.  Skin: Skin is warm and dry. No rash noted.  Psychiatric: He has a normal mood and affect.     ED Treatments / Results  Labs (all labs ordered are listed, but only abnormal results are displayed) Labs Reviewed - No data to display  EKG  EKG Interpretation  None       Radiology No results found.  Procedures Procedures (including critical care time)  Medications Ordered in ED Medications - No data to display   Initial Impression / Assessment and Plan / ED Course  I have reviewed the triage vital signs and the nursing notes.  Pertinent labs & imaging results that were available during my care of the patient were reviewed by me and considered in my medical decision making (see chart for details).     Patient with complaint of sinus symptoms including headache, facial pain, congestion, ear pain and subjective fever.   He is nontoxic in appearance and has exam and history that supports sinus infection. Will treat with Z-pack and encourage PCP follow up.  Final Clinical Impressions(s) / ED Diagnoses   Final diagnoses:  None   1. Acute sinusitis  ED Discharge Orders    None       Elpidio Anis, PA-C 11/04/17  1610    Shon Baton, MD 11/04/17 [phone]

## 2017-11-04 NOTE — Discharge Instructions (Signed)
Recommend Nasacort nasal spray as well as plain saline nasal spray for relief of symptoms of sinus pressure. Take Benadryl or claritin for additional relief of congestion. Take z-pack as directed for infection. Follow up with your doctor if symptoms persist.

## 2018-08-23 ENCOUNTER — Encounter (HOSPITAL_COMMUNITY): Payer: Self-pay | Admitting: Emergency Medicine

## 2018-08-23 ENCOUNTER — Emergency Department (HOSPITAL_COMMUNITY): Payer: Self-pay

## 2018-08-23 ENCOUNTER — Emergency Department (HOSPITAL_COMMUNITY)
Admission: EM | Admit: 2018-08-23 | Discharge: 2018-08-23 | Disposition: A | Discharge status: Home / Self Care | Payer: Self-pay | Attending: Emergency Medicine | Admitting: Emergency Medicine

## 2018-08-23 DIAGNOSIS — M79672 Pain in left foot: Secondary | ICD-10-CM | POA: Insufficient documentation

## 2018-08-23 DIAGNOSIS — Y9389 Activity, other specified: Secondary | ICD-10-CM | POA: Insufficient documentation

## 2018-08-23 DIAGNOSIS — I10 Essential (primary) hypertension: Secondary | ICD-10-CM | POA: Insufficient documentation

## 2018-08-23 DIAGNOSIS — Y999 Unspecified external cause status: Secondary | ICD-10-CM | POA: Insufficient documentation

## 2018-08-23 DIAGNOSIS — M25512 Pain in left shoulder: Secondary | ICD-10-CM | POA: Insufficient documentation

## 2018-08-23 DIAGNOSIS — M25562 Pain in left knee: Secondary | ICD-10-CM | POA: Insufficient documentation

## 2018-08-23 DIAGNOSIS — Y92410 Unspecified street and highway as the place of occurrence of the external cause: Secondary | ICD-10-CM | POA: Insufficient documentation

## 2018-08-23 DIAGNOSIS — Z79899 Other long term (current) drug therapy: Secondary | ICD-10-CM | POA: Insufficient documentation

## 2018-08-23 MED ORDER — NAPROXEN 500 MG PO TABS: 500.0000 mg | ORAL_TABLET | Freq: Two times a day (BID) | ORAL | 0 refills | Status: DC

## 2018-08-23 MED ORDER — KETOROLAC TROMETHAMINE 15 MG/ML IJ SOLN
15.0000 mg | Freq: Once | INTRAMUSCULAR | Status: AC
  Administered 2018-08-23: 15 mg via INTRAMUSCULAR
  Filled 2018-08-23: qty 1

## 2018-08-23 MED ORDER — METHOCARBAMOL 500 MG PO TABS: 500.0000 mg | ORAL_TABLET | Freq: Two times a day (BID) | ORAL | 0 refills | Status: DC
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
## 2018-08-23 NOTE — ED Notes (Signed)
Bed: WTR5 Expected date:  Expected time:  Means of arrival:  Comments: 

## 2018-08-23 NOTE — ED Notes (Signed)
Bed: WTR6 Expected date:  Expected time:  Means of arrival:  Comments: 

## 2018-08-23 NOTE — ED Notes (Signed)
Patient transported to X-ray 
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
## 2020-05-03 NOTE — Patient Instructions (Addendum)
Thank you for coming to see me today. It was a pleasure.    Please follow-up with PCP in 1-2 weeks  If you have any questions or concerns, please do not hesitate to call the office at [phone].  Best,   Dana Allan, MD Family Medicine Residency   Outpatient Mental Health Providers (No Insurance required or Self Pay)  [facility]) can see uninsured folks for outpatient therapy [email or web address] [address] [place], [place] [address] [phone]  [facility]    Walk-in Mon-Fri, 8am-3pm [email or web address] [address], [place], [place]  [phone]   [address]  [place] [phone] [facility] for psych med management, there may be a wait- if [facility] is working with clients for OPT, they will coordinate with [facility] for psych  [facility]   Walk-in-Clinic: Monday- Friday 9:00 AM - 4:00 PM [address]   [place], [place] [phone]  [facility] [place] ([employer]) walk in M-F 8am-12pm and  1pm-3pm [place]- [address]     [phone]  [employer] [address]  Phone: [phone]  [employer] (Mental Health and substance challenges) [address]   [place] [phone]    [email or web address]    [facility]  [place] -[address] [place], [place]     Phone:  [phone] [place]-  [address]  [phone]   [facility]  [address] [place]  [phone] [email or web address]   [facility] ( virtual or zoom therapy) [email or web address]  [address] [place]  [phone]    [facility] [phone]  grief counseling, dementia and caregiver support    [facility] Walk-in MWF 12:30 to 3:00     [address] [place] [address]  [phone]  [email or web address] call to schedule an appointment     [facility]ental [facility] Classes ,Support group, Peer support services, [address] [place], [place] [address] [phone]  PhotoSolver.pl           National [employer] on Mental Illness (NAMI) [place]- Wellness classes, Support groups        [address], [place], [place] [address] [phone]   [email or web address]   [facility]  (Psycho-social Rehabilitation clubhouse, Individual and group therapy) [address] [place], [place] [address]   336- [phone]  24- Hour Availability:  [facility] [phone] or [phone] * [facility] [employer] (Domestic Violence, Rape, etc. )[phone] [place]er [phone] or [phone] * [facility] 7755423741 only) [phone] (after hours) *[facility] [phone] *[place] National Suicide Hotline 3212230272 Len Childs)

## 2020-05-03 NOTE — Progress Notes (Signed)
    SUBJECTIVE:   CHIEF COMPLAINT / HPI: Right ear pain  Patient reports right ear pain x3 to 4 days.  Denies any fevers, headaches, nasal discharge, cough or sore throat.  Reports having some drainage and trying to clean it with a Q-tip.  He noted some bloody discharge.  Reports muffled sounds.  Hypertension Patient with history of hypertension was previously taking hydrochlorothiazide and metoprolol.  Patient reports stopped taking medication long time ago he felt he did not need it.  BP today 142/80.  Patient reports that he was running earlier this morning.  PERTINENT  PMH / PSH:  HTN Recurrent Ear infections  Myringotomy  OBJECTIVE:   BP (!) 142/80   Pulse (!) 105   Ht 5\' 7"  (1.702 m)   Wt (!) 460 lb 12.8 oz (209 kg)   SpO2 97%   BMI 72.17 kg/m    General: Alert and oriented, no apparent distress  Rt ear: Pain on palpation.  Canal edematous with mild erythema and moderate amount of purulent discharge.TM's visible bilaterally, no bulging appreciated but erythematous.  No pharyengeal erythema. Neck: right sided tenderness, no lymphadenopathy appreciated   ASSESSMENT/PLAN:   Ear pain Likely secondary to acute otits media and externa given edema, discharge and pain on palpation over tragus.   -Ciprodex 3 drops to right ear 2 times daily x7/7, if patient develops systemic symptoms can consider adding po antibiotics -Follow up as needed.  Hypertension SBP 140/80 today. Reports was on BP medications but stopped because he didn't feel that he needed it.   -Bmet, HbA1c today -Discussed diet and exercise, patient not interested in nutritional consult at this time -Follow up with PCP in 1 week to discuss labs and restarting BP meds     , MD [facility]

## 2020-05-04 ENCOUNTER — Encounter: Payer: Self-pay | Admitting: Family Medicine

## 2020-05-04 ENCOUNTER — Ambulatory Visit (INDEPENDENT_AMBULATORY_CARE_PROVIDER_SITE_OTHER): Payer: Self-pay | Admitting: Family Medicine

## 2020-05-04 ENCOUNTER — Other Ambulatory Visit: Payer: Self-pay

## 2020-05-04 VITALS — BP 142/80 | HR 105 | Ht 67.0 in | Wt >= 6400 oz

## 2020-05-04 DIAGNOSIS — H9201 Otalgia, right ear: Secondary | ICD-10-CM

## 2020-05-04 DIAGNOSIS — Z0000 Encounter for general adult medical examination without abnormal findings: Secondary | ICD-10-CM

## 2020-05-04 DIAGNOSIS — I10 Essential (primary) hypertension: Secondary | ICD-10-CM

## 2020-05-04 LAB — POCT GLYCOSYLATED HEMOGLOBIN (HGB A1C): Hemoglobin A1C: 5.8 % — AB (ref 4.0–5.6)

## 2020-05-04 MED ORDER — CIPROFLOXACIN-DEXAMETHASONE 0.3-0.1 % OT SUSP: 3.0000 [drp] | Freq: Two times a day (BID) | OTIC | 0 refills | Status: AC

## 2020-05-05 LAB — LIPID PANEL
Chol/HDL Ratio: 4.9 ratio (ref 0.0–5.0)
Cholesterol, Total: 232 mg/dL — ABNORMAL HIGH (ref 100–199)
HDL: 47 mg/dL (ref >39)
LDL Chol Calc (NIH): 156 mg/dL — ABNORMAL HIGH (ref 0–99)
Triglycerides: 160 mg/dL — ABNORMAL HIGH (ref 0–149)
VLDL Cholesterol Cal: 29 mg/dL (ref 5–40)

## 2020-05-05 LAB — BASIC METABOLIC PANEL
BUN/Creatinine Ratio: 16 (ref 9–20)
BUN: 16 mg/dL (ref 6–20)
CO2: 19 mmol/L — ABNORMAL LOW (ref 20–29)
Calcium: 9.4 mg/dL (ref 8.7–10.2)
Chloride: 101 mmol/L (ref 96–106)
Creatinine, Ser: 1.02 mg/dL (ref 0.76–1.27)
GFR calc Af Amer: 119 mL/min/{1.73_m2} (ref >59)
GFR calc non Af Amer: 103 mL/min/{1.73_m2} (ref >59)
Glucose: 152 mg/dL — ABNORMAL HIGH (ref 65–99)
Potassium: 4.1 mmol/L (ref 3.5–5.2)
Sodium: 141 mmol/L (ref 134–144)

## 2020-05-05 LAB — HCV INTERPRETATION

## 2020-05-05 LAB — HCV AB W REFLEX TO QUANT PCR: HCV Ab: <0.1 s/co ratio (ref 0.0–0.9)

## 2020-05-07 ENCOUNTER — Encounter: Payer: Self-pay | Admitting: Family Medicine

## 2020-05-07 NOTE — Assessment & Plan Note (Signed)
Likely secondary to acute otits media and externa given edema, discharge and pain on palpation over tragus.   -Ciprodex 3 drops to right ear 2 times daily x7/7, if patient develops systemic symptoms can consider adding po antibiotics -Follow up as needed.

## 2020-05-07 NOTE — Assessment & Plan Note (Signed)
SBP 140/80 today. Reports was on BP medications but stopped because he didn't feel that he needed it.   -Bmet, HbA1c today -Discussed diet and exercise, patient not interested in nutritional consult at this time -Follow up with PCP in 1 week to discuss labs and restarting BP meds

## 2020-05-08 ENCOUNTER — Telehealth: Payer: Self-pay | Admitting: Family Medicine

## 2020-05-08 NOTE — Telephone Encounter (Signed)
New ph# added.  Pt awaiting lab results

## 2020-05-09 NOTE — Telephone Encounter (Signed)
Please call and book patient with PCP to discuss blood work and BP check.  Thank you Dana Allan, MD Family Medicine Residency

## 2020-05-11 ENCOUNTER — Encounter: Payer: Self-pay | Admitting: Family Medicine

## 2020-05-11 NOTE — Telephone Encounter (Signed)
Pt scheduled on 8/4. Mom already has an appt on that day. Sunday Spillers, CMA

## 2020-05-24 ENCOUNTER — Ambulatory Visit (INDEPENDENT_AMBULATORY_CARE_PROVIDER_SITE_OTHER): Payer: Self-pay | Admitting: Family Medicine

## 2020-05-24 ENCOUNTER — Encounter: Payer: Self-pay | Admitting: Family Medicine

## 2020-05-24 ENCOUNTER — Other Ambulatory Visit: Payer: Self-pay

## 2020-05-24 DIAGNOSIS — K7581 Nonalcoholic steatohepatitis (NASH): Secondary | ICD-10-CM

## 2020-05-24 DIAGNOSIS — R7303 Prediabetes: Secondary | ICD-10-CM

## 2020-05-24 DIAGNOSIS — R030 Elevated blood-pressure reading, without diagnosis of hypertension: Secondary | ICD-10-CM

## 2020-05-24 DIAGNOSIS — R5383 Other fatigue: Secondary | ICD-10-CM | POA: Insufficient documentation

## 2020-05-24 NOTE — Patient Instructions (Signed)
I will call with blood test results tomorrow.  See me in one month. Goal is to weigh 455 You told me that you would reach that goal by being more active. You also planned to look into classes because it is easier to work out with someone than alone.   Officially, you are prediabetic.

## 2020-05-25 ENCOUNTER — Encounter: Payer: Self-pay | Admitting: Family Medicine

## 2020-05-25 DIAGNOSIS — E669 Obesity, unspecified: Secondary | ICD-10-CM | POA: Insufficient documentation

## 2020-05-25 DIAGNOSIS — E119 Type 2 diabetes mellitus without complications: Secondary | ICD-10-CM | POA: Insufficient documentation

## 2020-05-25 DIAGNOSIS — E1169 Type 2 diabetes mellitus with other specified complication: Secondary | ICD-10-CM | POA: Insufficient documentation

## 2020-05-25 LAB — CBC
Hematocrit: 44.9 % (ref 37.5–51.0)
Hemoglobin: 15.6 g/dL (ref 13.0–17.7)
MCH: 28.2 pg (ref 26.6–33.0)
MCHC: 34.7 g/dL (ref 31.5–35.7)
MCV: 81 fL (ref 79–97)
Platelets: 352 10*3/uL (ref 150–450)
RBC: 5.53 x10E6/uL (ref 4.14–5.80)
RDW: 13.4 % (ref 11.6–15.4)
WBC: 9.7 10*3/uL (ref 3.4–10.8)

## 2020-05-25 LAB — HEPATIC FUNCTION PANEL
ALT: 99 IU/L — ABNORMAL HIGH (ref 0–44)
AST: 56 IU/L — ABNORMAL HIGH (ref 0–40)
Albumin: 4.4 g/dL (ref 4.1–5.2)
Alkaline Phosphatase: 54 IU/L (ref 48–121)
Bilirubin Total: 0.5 mg/dL (ref 0.0–1.2)
Bilirubin, Direct: 0.15 mg/dL (ref 0.00–0.40)
Total Protein: 7.3 g/dL (ref 6.0–8.5)

## 2020-05-25 LAB — TSH: TSH: 2.09 u[IU]/mL (ref 0.450–4.500)

## 2020-05-25 NOTE — Progress Notes (Signed)
    SUBJECTIVE:   CHIEF COMPLAINT / HPI:   FU labs and more on fatigue. Maxwell Rose was informed he has prediabetes based on A1C elevation.  He was simultaneously relieved and dismayed.   Morbid obesity: He is tire of people "yelling at him."  He feels he is eating a much healthier diet, with very few sodas and very little junk food.  He believes his biggest chance to improve is with increased exercise.  Some days he does OK.  Other days, he cannot motivate himself to get out of bed.  No recent TSH.  No bleeding. He does not endorse depression symptoms other than lack of energy.  He lives with parent, both of which are sedentary with poor health habits.   BP has been borderline.  Not on any meds HX of NASH.  No recent LFTs    OBJECTIVE:   BP 122/76   Pulse 100   Ht 5\' 7"  (1.702 m)   Wt (!) 461 lb 6.4 oz (209.3 kg)   SpO2 96%   BMI 72.27 kg/m   Normal BP today noted.  Note patient has had a 75 lb weight gain since Jan 2018. Lungs clear Cardiac RRR without m or g Skin of neck acanthosis nigricans.  >50% of time spent counseling on weight loss.  Duration of visit was 25 minutes.  ASSESSMENT/PLAN:   NASH (nonalcoholic steatohepatitis) Rechecked LFTs which show mild elevation.  The treatment for all his medical problems is weight loss.  Morbid obesity with body mass index of 50 or higher We spent considerable time setting SMART goals.  His goal is to lose six pounds in the next months and plans to do that by becoming more active.  I could not get him to commit to a specific activity plan.  Prediabetes The treatment for all his medical problems is weight loss  Prehypertension Given today's normal blood pressure, I believe it is more appropriate to call him prehypertensive as opposed to hypertension.  The treatment for all his medical problems is weight loss     Feb 2018, MD [facility]

## 2020-05-25 NOTE — Assessment & Plan Note (Signed)
Rechecked LFTs which show mild elevation.  The treatment for all his medical problems is weight loss.

## 2020-05-25 NOTE — Assessment & Plan Note (Signed)
The treatment for all his medical problems is weight loss

## 2020-05-25 NOTE — Assessment & Plan Note (Signed)
Given today's normal blood pressure, I believe it is more appropriate to call him prehypertensive as opposed to hypertension.  The treatment for all his medical problems is weight loss

## 2020-05-25 NOTE — Assessment & Plan Note (Signed)
We spent considerable time setting SMART goals.  His goal is to lose six pounds in the next months and plans to do that by becoming more active.  I could not get him to commit to a specific activity plan.

## 2020-09-26 ENCOUNTER — Emergency Department (HOSPITAL_COMMUNITY): Payer: Medicaid Other

## 2020-09-26 ENCOUNTER — Emergency Department (HOSPITAL_COMMUNITY)
Admission: EM | Admit: 2020-09-26 | Discharge: 2020-09-27 | Disposition: A | Discharge status: Home / Self Care | Payer: Medicaid Other | Attending: Emergency Medicine | Admitting: Emergency Medicine

## 2020-09-26 ENCOUNTER — Ambulatory Visit (HOSPITAL_COMMUNITY)
Admission: EM | Admit: 2020-09-26 | Discharge: 2020-09-26 | Disposition: A | Discharge status: Home / Self Care | Payer: Medicaid Other

## 2020-09-26 ENCOUNTER — Other Ambulatory Visit: Payer: Self-pay

## 2020-09-26 DIAGNOSIS — I10 Essential (primary) hypertension: Secondary | ICD-10-CM | POA: Insufficient documentation

## 2020-09-26 DIAGNOSIS — R55 Syncope and collapse: Secondary | ICD-10-CM | POA: Insufficient documentation

## 2020-09-26 DIAGNOSIS — Z20822 Contact with and (suspected) exposure to covid-19: Secondary | ICD-10-CM | POA: Insufficient documentation

## 2020-09-26 DIAGNOSIS — S0990XA Unspecified injury of head, initial encounter: Secondary | ICD-10-CM | POA: Insufficient documentation

## 2020-09-26 DIAGNOSIS — W228XXA Striking against or struck by other objects, initial encounter: Secondary | ICD-10-CM | POA: Insufficient documentation

## 2020-09-26 DIAGNOSIS — S060X0A Concussion without loss of consciousness, initial encounter: Secondary | ICD-10-CM | POA: Insufficient documentation

## 2020-09-26 LAB — CBC WITH DIFFERENTIAL/PLATELET
Abs Immature Granulocytes: 0.02 10*3/uL (ref 0.00–0.07)
Basophils Absolute: 0 10*3/uL (ref 0.0–0.1)
Basophils Relative: 0 %
Eosinophils Absolute: 0.2 10*3/uL (ref 0.0–0.5)
Eosinophils Relative: 3 %
HCT: 46.2 % (ref 39.0–52.0)
Hemoglobin: 14.8 g/dL (ref 13.0–17.0)
Immature Granulocytes: 0 %
Lymphocytes Relative: 29 %
Lymphs Abs: 2 10*3/uL (ref 0.7–4.0)
MCH: 26.5 pg (ref 26.0–34.0)
MCHC: 32 g/dL (ref 30.0–36.0)
MCV: 82.8 fL (ref 80.0–100.0)
Monocytes Absolute: 0.9 10*3/uL (ref 0.1–1.0)
Monocytes Relative: 12 %
Neutro Abs: 3.9 10*3/uL (ref 1.7–7.7)
Neutrophils Relative %: 56 %
Platelets: 221 10*3/uL (ref 150–400)
RBC: 5.58 MIL/uL (ref 4.22–5.81)
RDW: 13.6 % (ref 11.5–15.5)
WBC: 7 10*3/uL (ref 4.0–10.5)
nRBC: 0 % (ref 0.0–0.2)

## 2020-09-26 LAB — COMPREHENSIVE METABOLIC PANEL
ALT: 99 U/L — ABNORMAL HIGH (ref 0–44)
AST: 87 U/L — ABNORMAL HIGH (ref 15–41)
Albumin: 3.3 g/dL — ABNORMAL LOW (ref 3.5–5.0)
Alkaline Phosphatase: 45 U/L (ref 38–126)
Anion gap: 11 (ref 5–15)
BUN: 13 mg/dL (ref 6–20)
CO2: 22 mmol/L (ref 22–32)
Calcium: 9 mg/dL (ref 8.9–10.3)
Chloride: 105 mmol/L (ref 98–111)
Creatinine, Ser: 1.07 mg/dL (ref 0.61–1.24)
GFR, Estimated: >60 mL/min (ref >60)
Glucose, Bld: 126 mg/dL — ABNORMAL HIGH (ref 70–99)
Potassium: 3.7 mmol/L (ref 3.5–5.1)
Sodium: 138 mmol/L (ref 135–145)
Total Bilirubin: 0.9 mg/dL (ref 0.3–1.2)
Total Protein: 7.3 g/dL (ref 6.5–8.1)

## 2020-09-26 NOTE — ED Triage Notes (Addendum)
Pt reports he slipped getting out of the shower yesterday and hit the back of his neck and  Head on side of tube. Pt now has a HA and is having a hard time with his thoughts. Pt ambulatory to room . Pt A/O x4. Pt also reports his vision is blurry and he is having a hard time concentrating .

## 2020-09-26 NOTE — ED Triage Notes (Signed)
Pt. Stated, [facility] went to [facility] and they sent me here. I fell yesterday and hit my head. Ive had vision problems, hard concentrating. I black out out a couple of times, I was by myself.--------------------

## 2020-09-27 ENCOUNTER — Emergency Department (HOSPITAL_COMMUNITY): Payer: Medicaid Other

## 2020-09-27 LAB — RESP PANEL BY RT-PCR (FLU A&B, COVID) ARPGX2
Influenza A by PCR: NEGATIVE
Influenza B by PCR: NEGATIVE
SARS Coronavirus 2 by RT PCR: NEGATIVE

## 2020-09-27 MED ORDER — SODIUM CHLORIDE 0.9 % IV BOLUS
1000.0000 mL | Freq: Once | INTRAVENOUS | Status: AC
  Administered 2020-09-27: 1000 mL via INTRAVENOUS

## 2020-09-27 MED ORDER — DIPHENHYDRAMINE HCL 50 MG/ML IJ SOLN
25.0000 mg | Freq: Once | INTRAMUSCULAR | Status: AC
  Administered 2020-09-27: 25 mg via INTRAVENOUS
  Filled 2020-09-27: qty 1

## 2020-09-27 MED ORDER — PROCHLORPERAZINE EDISYLATE 10 MG/2ML IJ SOLN
10.0000 mg | Freq: Once | INTRAMUSCULAR | Status: AC
  Administered 2020-09-27: 10 mg via INTRAVENOUS
  Filled 2020-09-27: qty 2

## 2020-09-27 NOTE — ED Provider Notes (Signed)
[facility] EMERGENCY DEPARTMENT Provider Note   CSN: [id number] Arrival date & time: 09/26/20  1826     History Chief Complaint  Patient presents with  . Head Injury  . Eye Problem    Maxwell Rose is a 24 y.o. male.  HPI      24 year old male with history of bipolar, hypertension, morbid obesity, migraines, nonalcoholic steatohepatitis, depression, presents with concern for syncope, headache and blurred vision.  Reports 1 week ago he had an episode of coughing followed by syncope.  Yesterday, he was in the shower and began to feel lightheaded and had a syncopal episode, falling and hitting the back of his head.  Since then he has had blurred vision and headache.  Reports that the headache has been constant, "crushing" and severe.  Reports it is worse with bright lights.  He reports he is also had blurred vision, which he describes as blurring of his vision in both eyes.  Denies double vision or missing pieces of vision.  Denies numbness, weakness, facial droop, difficulty walking or talking.  Reports he was in a normal state of health prior to these events-denies fevers, chest pain, abdominal pain, nausea, vomiting, black or bloody stools diarrhea.  Does report he has had a couple of days of mild cough, with associated occasional wheezing.  Denies leg pain or swelling.  Also reports some scratchiness in the back of his throat when he coughs.  Denies known sick contacts.  He has not been vaccinated against COVID-19.  Past Medical History:  Diagnosis Date  . Anxiety   . Bipolar 1 disorder (HCC)   . Depression   . GERD (gastroesophageal reflux disease)   . Hearing difficulty   . Hiatal hernia   . Hiatal hernia   . Hypertension   . Obesity   . Vision abnormalities    near sited    Patient Active Problem List   Diagnosis Date Noted  . Prediabetes 05/25/2020  . Fatigue 05/24/2020  . 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 Morbid  obesity with body mass index of 50 or higher (HCC) 06/29/2012  . Prehypertension 12/06/2010  . Sleep apnea 10/26/2010  . 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 Severe major depression without psychotic features (HCC) 07/30/2010  . GERD 10/03/2009  . NASH (nonalcoholic steatohepatitis) 05/31/2009    Past Surgical History:  Procedure Laterality Date  . ADENOIDECTOMY    . MYRINGOTOMY WITH TUBE PLACEMENT Bilateral 11/08/2016   Procedure: BILATERAL MYRINGOTOMY WITH TUBE PLACEMENT;  Surgeon: Osborn Coho, MD;  Location: [facility] OR;  Service: ENT;  Laterality: Bilateral;  . TOOTH EXTRACTION N/A 01/31/2014   Procedure: EXTRACTION 3rd MOLARS;  Surgeon: Georgia Lopes, DDS;  Location: [facility] OR;  Service: Oral Surgery;  Laterality: N/A;  . TURBINATE REDUCTION N/A 11/08/2016   Procedure: BILATERAL TURBINATE REDUCTION;  Surgeon: Osborn Coho, MD;  Location: [facility] OR;  Service: ENT;  Laterality: N/A;  . TYMPANOSTOMY TUBE PLACEMENT     sev       Family History  Problem Relation Age of Onset  . Depression Mother   . Mental illness Mother   . Varicose Veins Mother   . Asthma Sister   . Learning disabilities Sister   . Arthritis Maternal Grandmother   . Cancer Maternal Grandmother   . COPD Maternal Grandmother   . Depression Maternal Grandmother   . Heart disease Maternal Grandmother   . Hyperlipidemia Maternal Grandmother   . Hypertension Maternal  Grandmother   . Arthritis Maternal Grandfather   . COPD Maternal Grandfather   . Diabetes Maternal Grandfather   . Hyperlipidemia Maternal Grandfather   . Hypertension Maternal Grandfather   . Arthritis Paternal Grandmother   . Arthritis Paternal Grandfather     Social History   Tobacco Use  . Smoking status: Never Smoker  . Smokeless tobacco: Never Used  Vaping Use  . Vaping Use: Never used  Substance Use Topics  . Alcohol use: No  . Drug use: No    Home Medications Prior to Admission medications   Medication Sig  Start Date End Date Taking? Authorizing Provider  fluticasone (FLONASE) 50 MCG/ACT nasal spray Place 1 spray into both nostrils in the morning and at bedtime.    [provider]  omeprazole (PRILOSEC) 40 MG capsule Take 1 capsule (40 mg total) by mouth daily. 10/31/16   Moses Manners, MD    Allergies    Penicillins  Review of Systems   Review of Systems  Constitutional: Negative for fever.  HENT: Positive for sore throat.   Eyes: Positive for visual disturbance.  Respiratory: Positive for cough and wheezing. Negative for shortness of breath.   Cardiovascular: Negative for chest pain.  Gastrointestinal: Negative for abdominal pain, nausea and vomiting.  Genitourinary: Negative for difficulty urinating.  Musculoskeletal: Negative for back pain and neck stiffness.  Skin: Negative for rash.  Neurological: Positive for syncope and headaches. Negative for weakness and numbness.    Physical Exam Updated Vital Signs BP 106/82 (BP Location: Right Arm)   Pulse 76   Temp 98.7 F (37.1 C) (Oral)   Resp 18   SpO2 97%   Physical Exam Vitals and nursing note reviewed.  Constitutional:      General: He is not in acute distress.    Appearance: He is well-developed. He is not diaphoretic.  HENT:     Head: Normocephalic and atraumatic.  Eyes:     General: No visual field deficit.    Conjunctiva/sclera: Conjunctivae normal.  Cardiovascular:     Rate and Rhythm: Normal rate and regular rhythm.     Heart sounds: Normal heart sounds. No murmur heard.  No friction rub. No gallop.   Pulmonary:     Effort: Pulmonary effort is normal. No respiratory distress.     Breath sounds: Normal breath sounds. No wheezing or rales.  Abdominal:     General: There is no distension.     Palpations: Abdomen is soft.     Tenderness: There is no abdominal tenderness. There is no guarding.  Musculoskeletal:     Cervical back: Normal range of motion.  Skin:    General: Skin is warm and dry.   Neurological:     Mental Status: He is alert and oriented to person, place, and time.     GCS: GCS eye subscore is 4. GCS verbal subscore is 5. GCS motor subscore is 6.     Cranial Nerves: Cranial nerves are intact. No dysarthria or facial asymmetry.     Sensory: Sensation is intact. No sensory deficit.     Motor: Motor function is intact. No weakness or pronator drift.     Coordination: Coordination normal. Heel to Shin Test normal.     ED Results / Procedures / Treatments   Labs (all labs ordered are listed, but only abnormal results are displayed) Labs Reviewed  COMPREHENSIVE METABOLIC PANEL - Abnormal; Notable for the following components:      Result Value   Glucose,  Bld 126 (*)    Albumin 3.3 (*)    AST 87 (*)    ALT 99 (*)    All other components within normal limits  RESP PANEL BY RT-PCR (FLU A&B, COVID) ARPGX2  CBC WITH DIFFERENTIAL/PLATELET    EKG EKG Interpretation  Date/Time:  Tuesday September 26 2020 18:57:56 EST Ventricular Rate:  108 PR Interval:  190 QRS Duration: 70 QT Interval:  320 QTC Calculation: 428 R Axis:   50 Text Interpretation: Sinus tachycardia Otherwise normal ECG No significant change since prior 1/18 Confirmed by Meridee Score [phone]) on 09/26/2020 8:26:28 PM   Radiology CT Head Wo Contrast  Result Date: 09/26/2020 CLINICAL DATA:  Status post trauma. EXAM: CT HEAD WITHOUT CONTRAST TECHNIQUE: Contiguous axial images were obtained from the base of the skull through the vertex without intravenous contrast. COMPARISON:  None. FINDINGS: Brain: No evidence of acute infarction, hemorrhage, hydrocephalus, extra-axial collection or mass lesion/mass effect. Vascular: No hyperdense vessel or unexpected calcification. Skull: Normal. Negative for fracture or focal lesion. Sinuses/Orbits: There is moderate to marked severity left-sided frontal sinus mucosal thickening. Other: None. IMPRESSION: 1. No acute intracranial abnormality. 2. Moderate to marked  severity frontal sinus disease. Electronically Signed   By: Aram Candela M.D.   On: 09/26/2020 19:59    Procedures Procedures (including critical care time)  Medications Ordered in ED Medications  sodium chloride 0.9 % bolus 1,000 mL (has no administration in time range)  prochlorperazine (COMPAZINE) injection 10 mg (has no administration in time range)  diphenhydrAMINE (BENADRYL) injection 25 mg (has no administration in time range)    ED Course  I have reviewed the triage vital signs and the nursing notes.  Pertinent labs & imaging results that were available during my care of the patient were reviewed by me and considered in my medical decision making (see chart for details).    MDM Rules/Calculators/A&P                          24 year old male with history of bipolar, hypertension, morbid obesity, migraines, nonalcoholic steatohepatitis, depression, presents with concern for syncope, headache and blurred vision.  EKG was evaluate you waited by me and showed no sign of acute abnormalities.  CBC shows no significant anemia.  CMP shows electrolytes within normal limits.  CT head completed shows no sign of intracranial hemorrhage or fracture.  CT head does show severe frontal sinus disease.  He has bilateral neck tenderness, without significant midline tenderness and have low suspicion for cervical spine fracture.  Doubt carotid or vertebral dissection as I do not see signs of CVA on exam.  Visual changes described as bilateral blurriness, no visual field or diplopia.  CXR without signs of pneumonia or pneumothorax. COVID testing negative.  Suspect likely vasovagal syncope.  Does not have tachycardia, hypoxia, asymmetric leg swelling or acute chest pain or dyspnea to suggest PE.  Felt lightheaded and fell.  Suspect vasovagal syncope leading to head trauma with concussion and migraine contributing to headache and visual symptoms.  Given migraine concktail in the ED. Recommend continued  close follow up with PCP.   Final Clinical Impression(s) / ED Diagnoses Final diagnoses:  Injury of head, initial encounter  Concussion without loss of consciousness, initial encounter  Syncope, unspecified syncope type    Rx / DC Orders ED Discharge Orders    None       Alvira Monday, MD 09/27/20 [phone]

## 2020-09-27 NOTE — ED Notes (Signed)
Patient refused vital signs stating "I'm good."

## 2020-09-27 NOTE — ED Notes (Signed)
Patient stated he was ready to go home MD made aware.

## 2021-11-01 ENCOUNTER — Other Ambulatory Visit: Payer: Self-pay

## 2021-11-01 ENCOUNTER — Encounter: Payer: Self-pay | Admitting: Family Medicine

## 2021-11-01 ENCOUNTER — Ambulatory Visit (INDEPENDENT_AMBULATORY_CARE_PROVIDER_SITE_OTHER): Payer: Self-pay | Admitting: Family Medicine

## 2021-11-01 ENCOUNTER — Ambulatory Visit (INDEPENDENT_AMBULATORY_CARE_PROVIDER_SITE_OTHER): Payer: Self-pay

## 2021-11-01 DIAGNOSIS — R7303 Prediabetes: Secondary | ICD-10-CM

## 2021-11-01 DIAGNOSIS — R5383 Other fatigue: Secondary | ICD-10-CM

## 2021-11-01 DIAGNOSIS — H9193 Unspecified hearing loss, bilateral: Secondary | ICD-10-CM

## 2021-11-01 DIAGNOSIS — K7581 Nonalcoholic steatohepatitis (NASH): Secondary | ICD-10-CM

## 2021-11-01 DIAGNOSIS — E669 Obesity, unspecified: Secondary | ICD-10-CM

## 2021-11-01 DIAGNOSIS — E1169 Type 2 diabetes mellitus with other specified complication: Secondary | ICD-10-CM

## 2021-11-01 DIAGNOSIS — Z23 Encounter for immunization: Secondary | ICD-10-CM

## 2021-11-01 LAB — POCT GLYCOSYLATED HEMOGLOBIN (HGB A1C): Hemoglobin A1C: 7.5 % — AB (ref 4.0–5.6)

## 2021-11-01 NOTE — Assessment & Plan Note (Signed)
Recheck labs.  Concerned for worsening with his weight gain.

## 2021-11-01 NOTE — Assessment & Plan Note (Signed)
Likely deconditioning and morbid obesity.  Check labs.

## 2021-11-01 NOTE — Assessment & Plan Note (Signed)
Weight loss.  Semaglutide.

## 2021-11-01 NOTE — Assessment & Plan Note (Signed)
Refer back to ENT.  In the meantime, use flonase regularly.

## 2021-11-01 NOTE — Assessment & Plan Note (Signed)
Oh my.  Discussed treatment opitons.  Because now diabetic, will recommend semaglutide.

## 2021-11-01 NOTE — Patient Instructions (Addendum)
For weight loss, always diet and exercise are a part. The two additional options are meds and surgery.  The medicine is the Ozempic generic semaglutide.   I will call with lab test rest results. See me again in one month.   I also put in the ENT referral

## 2021-11-01 NOTE — Progress Notes (Signed)
° ° °  SUBJECTIVE:   CHIEF COMPLAINT / HPI:   First visit in 1.5 years for patient with several chronic problems, mostly stemming from his morbid obesity. Morbid obesity.  Now motivated to lose.  Has joint pains and dyspnea on exertion.   Hx of steatohepatitis.  Sadly, weight is up 15 lbs since last visit.  Needs recheck LFTs Hx of prediabetes.  Now frankly diabetic with A1C=7.5.  He has had some polydipsea. Lacks energy.  Snores.  Sleep study ~2 years ago did not meet criteria for sleep apnea and CPAP. Chronic right ear problems.  Has previously seen ENT.  Tube in right ear.  Feels like ear is congested and not hearing well.  Uses flnoase sporatically. HPDP, needs both flu and COVID booster today.    OBJECTIVE:   BP 117/73    Pulse (!) 103    Ht 5\' 7"  (1.702 m)    Wt (!) 478 lb (216.8 kg)    SpO2 96%    BMI 74.87 kg/m   Rt tm tube (partially or fully?) extruded and laying against TM which is cloudy. Neck no thyromegally Lungs clear Cardiac RRR without m or g   ASSESSMENT/PLAN:   Hearing loss associated with syndrome of both ears Refer back to ENT.  In the meantime, use flonase regularly.  Fatigue Likely deconditioning and morbid obesity.  Check labs.  Morbid obesity with body mass index of 50 or higher Oh my.  Discussed treatment opitons.  Because now diabetic, will recommend semaglutide.  NASH (nonalcoholic steatohepatitis) Recheck labs.  Concerned for worsening with his weight gain.  Diabetes mellitus type 2 in obese (HCC) Weight loss.  Semaglutide.     , MD [facility]

## 2021-11-02 ENCOUNTER — Telehealth: Payer: Self-pay | Admitting: Family Medicine

## 2021-11-02 DIAGNOSIS — E669 Obesity, unspecified: Secondary | ICD-10-CM

## 2021-11-02 DIAGNOSIS — E1169 Type 2 diabetes mellitus with other specified complication: Secondary | ICD-10-CM

## 2021-11-02 LAB — LIPID PANEL
Chol/HDL Ratio: 4.6 ratio (ref 0.0–5.0)
Cholesterol, Total: 208 mg/dL — ABNORMAL HIGH (ref 100–199)
HDL: 45 mg/dL (ref >39)
LDL Chol Calc (NIH): 131 mg/dL — ABNORMAL HIGH (ref 0–99)
Triglycerides: 180 mg/dL — ABNORMAL HIGH (ref 0–149)
VLDL Cholesterol Cal: 32 mg/dL (ref 5–40)

## 2021-11-02 LAB — CBC
Hematocrit: 45.5 % (ref 37.5–51.0)
Hemoglobin: 14.9 g/dL (ref 13.0–17.7)
MCH: 26.8 pg (ref 26.6–33.0)
MCHC: 32.7 g/dL (ref 31.5–35.7)
MCV: 82 fL (ref 79–97)
Platelets: 352 10*3/uL (ref 150–450)
RBC: 5.55 x10E6/uL (ref 4.14–5.80)
RDW: 13.5 % (ref 11.6–15.4)
WBC: 10 10*3/uL (ref 3.4–10.8)

## 2021-11-02 LAB — CMP14+EGFR
ALT: 110 IU/L — ABNORMAL HIGH (ref 0–44)
AST: 79 IU/L — ABNORMAL HIGH (ref 0–40)
Albumin/Globulin Ratio: 1.5 (ref 1.2–2.2)
Albumin: 4.3 g/dL (ref 4.1–5.2)
Alkaline Phosphatase: 60 IU/L (ref 44–121)
BUN/Creatinine Ratio: 13 (ref 9–20)
BUN: 13 mg/dL (ref 6–20)
Bilirubin Total: 0.5 mg/dL (ref 0.0–1.2)
CO2: 22 mmol/L (ref 20–29)
Calcium: 9.9 mg/dL (ref 8.7–10.2)
Chloride: 101 mmol/L (ref 96–106)
Creatinine, Ser: 1.02 mg/dL (ref 0.76–1.27)
Globulin, Total: 2.9 g/dL (ref 1.5–4.5)
Glucose: 159 mg/dL — ABNORMAL HIGH (ref 70–99)
Potassium: 4.5 mmol/L (ref 3.5–5.2)
Sodium: 140 mmol/L (ref 134–144)
Total Protein: 7.2 g/dL (ref 6.0–8.5)
eGFR: 105 mL/min/{1.73_m2} (ref >59)

## 2021-11-02 LAB — TSH: TSH: 3.57 u[IU]/mL (ref 0.450–4.500)

## 2021-11-02 MED ORDER — SEMAGLUTIDE(0.25 OR 0.5MG/DOS) 2 MG/1.5ML ~~LOC~~ SOPN: 0.2500 mg | PEN_INJECTOR | SUBCUTANEOUS | 3 refills | Status: DC

## 2021-11-02 NOTE — Telephone Encounter (Signed)
Called and informed: Diabetic.  Start Ozempic as we discussed during visit.  Should help with weight loss. Cholesterol remains high.  For now weight loss.  Consider statin (he is young.)  Still with liver inflammation presumed due to fatty liver.  Again weight loss is the treatment.

## 2022-03-26 ENCOUNTER — Ambulatory Visit (INDEPENDENT_AMBULATORY_CARE_PROVIDER_SITE_OTHER): Payer: Self-pay | Admitting: Family Medicine

## 2022-03-26 VITALS — BP 110/75 | HR 105 | Temp 98.6°F | Wt >= 6400 oz

## 2022-03-26 DIAGNOSIS — J0140 Acute pansinusitis, unspecified: Secondary | ICD-10-CM

## 2022-03-26 DIAGNOSIS — H9211 Otorrhea, right ear: Secondary | ICD-10-CM | POA: Insufficient documentation

## 2022-03-26 DIAGNOSIS — J329 Chronic sinusitis, unspecified: Secondary | ICD-10-CM | POA: Insufficient documentation

## 2022-03-26 MED ORDER — SULFAMETHOXAZOLE-TRIMETHOPRIM 800-160 MG PO TABS: 1.0000 | ORAL_TABLET | Freq: Two times a day (BID) | ORAL | 0 refills | Status: AC

## 2022-03-26 MED ORDER — NEOMYCIN-POLYMYXIN-HC 3.5-10000-1 OT SOLN: 4.0000 [drp] | Freq: Four times a day (QID) | OTIC | 0 refills | Status: AC

## 2022-03-26 NOTE — Assessment & Plan Note (Signed)
Pansinusitis given age or symptoms of nasal congestion, facial pain/pressure, purulent nasal discharge and minor criteria of headache, ear fullness and cough.  -Given PCN allergy, will treat with bactrim BID x10 days  -Return if no improvement -Can also continue Tylenol/ibuprofen for pain relief as well as conservative measures such as heat/ice to help with symptoms

## 2022-03-26 NOTE — Patient Instructions (Signed)
It was wonderful to see you today. I am sorry that you are not feeling well.   Today we talked about:  -You have a right-sided ear infection and may also have component of sinusitis present. -I am prescribing antibiotic eardrops that you should use 4 times a day for the next 10 days. -I am also sending an oral antibiotic which you should use twice a day for 10 days. -If you do not improve, it may be worth scheduling an appointment with your ENT doctor.   Thank you for choosing [facility].   Please call [phone] with any questions about today's appointment.  Please be sure to schedule follow up at the front  desk before you leave today.   Sabino Dick, DO PGY-2 Family Medicine

## 2022-03-26 NOTE — Assessment & Plan Note (Signed)
Acute onset of right otorrhea.  Can visualize tympanostomy tube in the right ear.  We will start Cortisporin otic drops to use 4 times daily x10 days.  Given his extensive ear hx with recurrent ear infections and tympanostomy tubes, I recommend follow-up with ENT physician if symptoms do not improve.

## 2022-03-26 NOTE — Progress Notes (Signed)
    SUBJECTIVE:   CHIEF COMPLAINT / HPI:   Maxwell Rose is a 26 y.o. male who presents to the [facility] clinic today accompanied by his mother to discuss congestion, yellow/green productive cough, shortness of breath, sore throat, posttussive vomiting, and headache for the last week.  Mother has given him NyQuil, sore throat spray, Benadryl, ibuprofen, used heating pads and ice packs without any relief.  He is also had some yellow-green discharge coming from his right ear for the last 3 days.  He has not had any fevers.  He has history of recurrent ear infections and mother states he has had ear tubes placed about 5-6 times total.  He follows with Dr. Annalee Genta with ENT, but has no upcoming appointments.  He states that his most bothersome symptom to him is the pressure that he feels in his head.  Mother states that she recently just on the hospital for a cough/asthma exacerbation.  Their maternal grandmother also recently had laryngitis.  The whole family seem to have diarrhea last week which improved.  He has allergies to penicillin, develops anaphylaxis with this.  He has not had any recent antibiotics.  PERTINENT  PMH / PSH:  Past Medical History:  Diagnosis Date   Anxiety    Bipolar 1 disorder (HCC)    Depression    GERD (gastroesophageal reflux disease)    Hearing difficulty    Hiatal hernia    Hiatal hernia    Hypertension    Obesity    Vision abnormalities    near sited    OBJECTIVE:   BP 110/75   Pulse (!) 105   Temp 98.6 F (37 C)   Wt (!) 479 lb 9.6 oz (217.5 kg)   SpO2 97%   BMI 75.12 kg/m    General: NAD, pleasant, able to participate in exam HEENT: Normocephalic, right ear with yellow discharge present- can also see tympanostomy tube present. Left ear erythematous, TM with chronic scarring and bullous changes. Tenderness with manipulation of right tragus but not left. Unable to visualize oropharynx well. Tenderness to palpation of maxillary, ethmoid and frontal  sinuses.  Cardiac: RRR, no murmurs. Respiratory: CTAB, normal effort, No wheezes, rales or rhonchi Abdomen: Morbidly obese Skin: warm and dry, no rashes noted  ASSESSMENT/PLAN:   Otorrhea of right ear Acute onset of right otorrhea.  Can visualize tympanostomy tube in the right ear.  We will start Cortisporin otic drops to use 4 times daily x10 days.  Given his extensive ear hx with recurrent ear infections and tympanostomy tubes, I recommend follow-up with ENT physician if symptoms do not improve.  Sinusitis Pansinusitis given age or symptoms of nasal congestion, facial pain/pressure, purulent nasal discharge and minor criteria of headache, ear fullness and cough.  -Given PCN allergy, will treat with bactrim BID x10 days  -Return if no improvement -Can also continue Tylenol/ibuprofen for pain relief as well as conservative measures such as heat/ice to help with symptoms     Sabino Dick, DO [facility]

## 2022-06-04 IMAGING — CR DG CHEST 2V
2 series · 2 of 2 positions shown · non-contrast
Comparison: 08/24/2017

CLINICAL DATA: Cough.  Slipped ative shower yesterday.

EXAM:
CHEST - 2 VIEW

[chest pa]
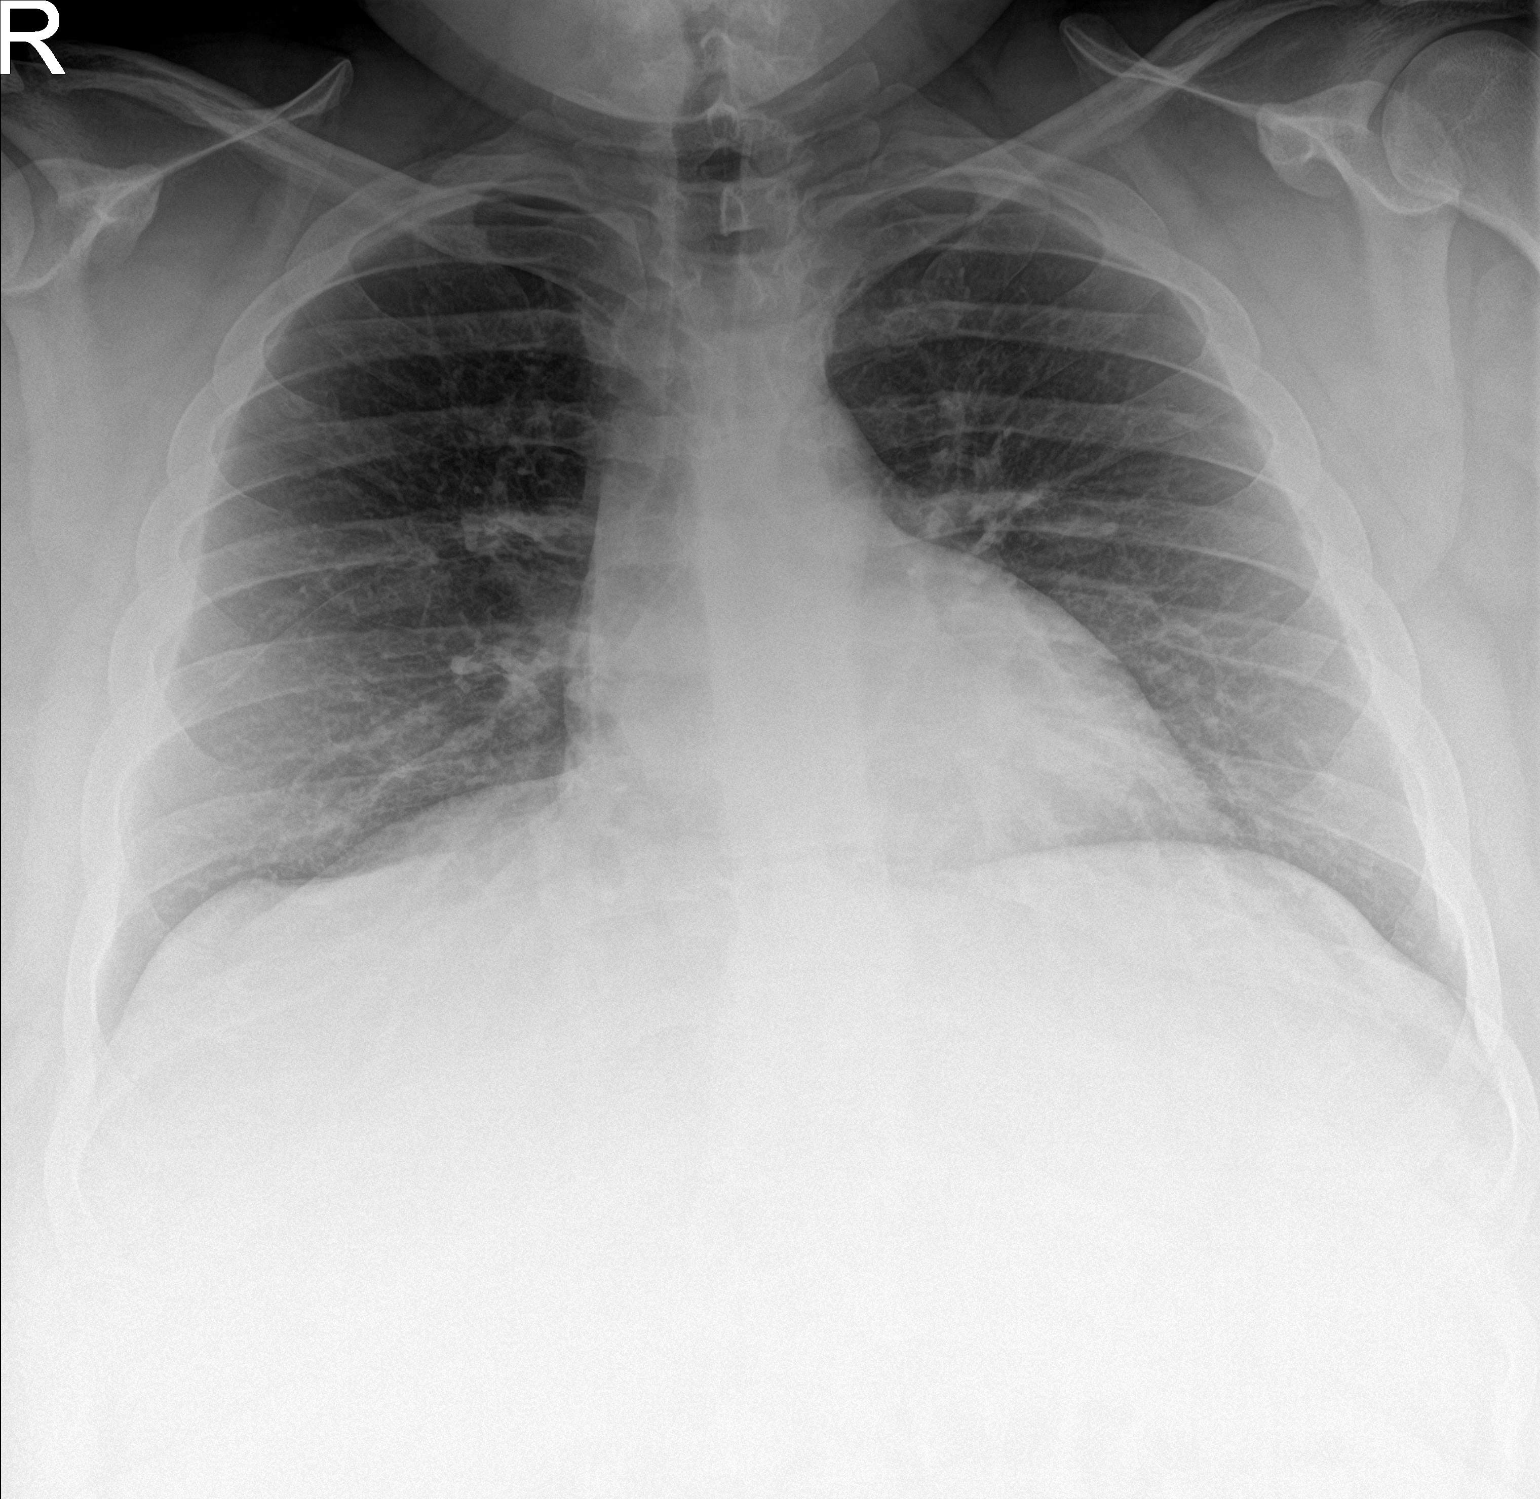

[chest lat]
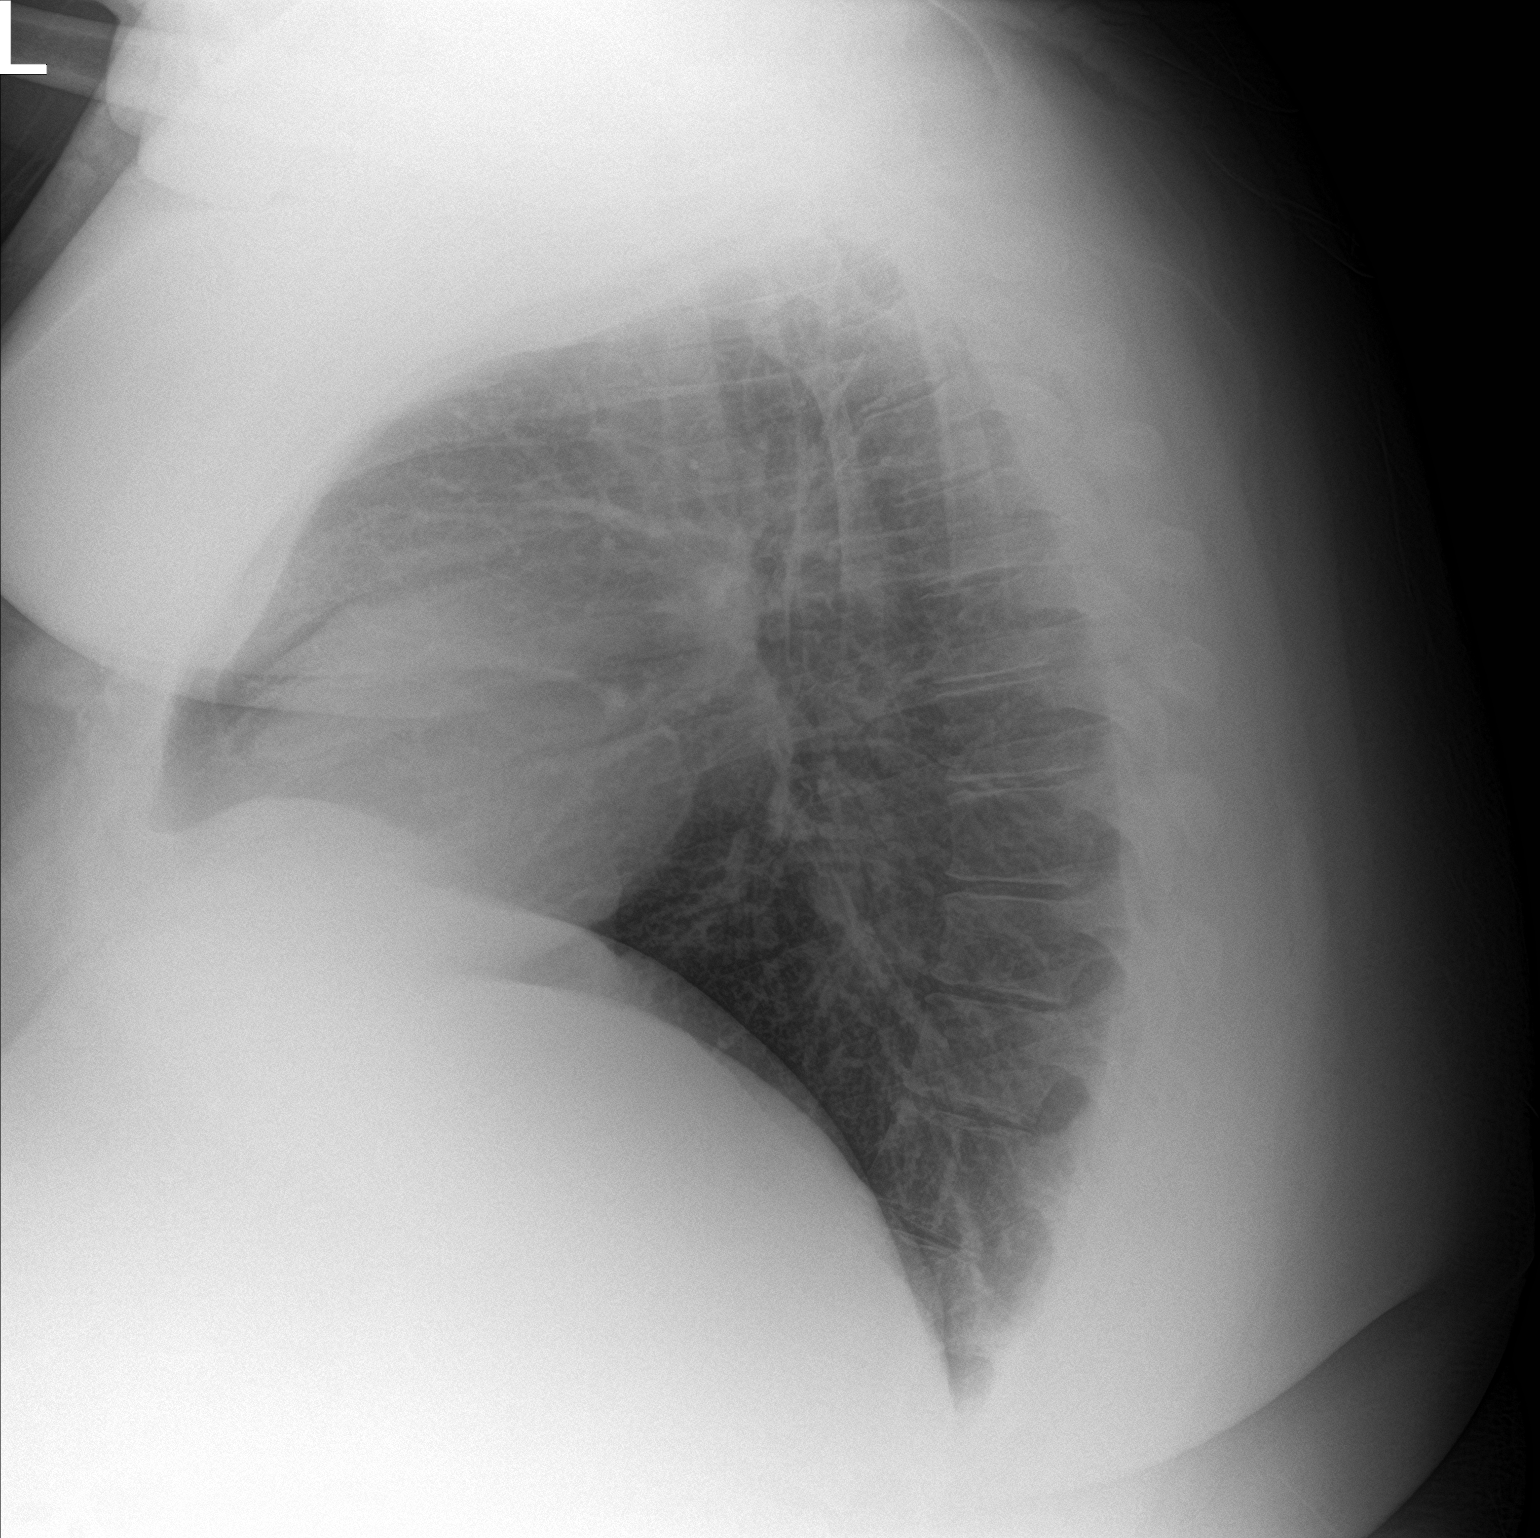

[2 of 2 positions shown; findings below may reference images not displayed]

FINDINGS: Normal heart size and mediastinal contours. No acute infiltrate or
edema. No effusion or pneumothorax. No acute osseous findings.
IMPRESSION: Negative chest.

## 2022-09-23 ENCOUNTER — Ambulatory Visit (INDEPENDENT_AMBULATORY_CARE_PROVIDER_SITE_OTHER): Payer: Medicaid Other | Admitting: Family Medicine

## 2022-09-23 ENCOUNTER — Encounter: Payer: Self-pay | Admitting: Family Medicine

## 2022-09-23 VITALS — BP 116/78 | HR 95 | Ht 67.0 in | Wt >= 6400 oz

## 2022-09-23 DIAGNOSIS — E1169 Type 2 diabetes mellitus with other specified complication: Secondary | ICD-10-CM | POA: Diagnosis present

## 2022-09-23 DIAGNOSIS — E669 Obesity, unspecified: Secondary | ICD-10-CM

## 2022-09-23 LAB — POCT GLYCOSYLATED HEMOGLOBIN (HGB A1C): HbA1c, POC (controlled diabetic range): 6.8 % (ref 0.0–7.0)

## 2022-09-24 DIAGNOSIS — H5213 Myopia, bilateral: Secondary | ICD-10-CM | POA: Diagnosis not present

## 2022-09-24 NOTE — Progress Notes (Signed)
Maxwell Rose arrived very early.  (Mother had early day appointment.)  I could not see him early because my other patients arrived on time.  He left without being seen.

## 2022-10-28 ENCOUNTER — Ambulatory Visit: Payer: Medicaid Other | Admitting: Family Medicine

## 2022-11-13 ENCOUNTER — Ambulatory Visit: Payer: Medicaid Other | Admitting: Family Medicine

## 2022-11-26 ENCOUNTER — Encounter: Payer: Self-pay | Admitting: Family Medicine

## 2022-12-30 NOTE — Progress Notes (Unsigned)
    SUBJECTIVE:   CHIEF COMPLAINT / HPI:   T2DM- last A1c 6.8 in Dec 2023. On semaglutide ??. Weight loss ***  PERTINENT  PMH / PSH: T2DM, OSA, GERD, NASH,obesity, depression  OBJECTIVE:   There were no vitals taken for this visit.  General: A&O, NAD HEENT: No sign of trauma, EOM grossly intact Cardiac: RRR, no m/r/g Respiratory: CTAB, normal WOB, no w/c/r GI: Soft, NTTP, non-distended  Extremities: NTTP, no peripheral edema. Neuro: Normal gait, moves all four extremities appropriately. Psych: Appropriate mood and affect   ASSESSMENT/PLAN:   No problem-specific Assessment & Plan notes found for this encounter.     Lenoria Chime, MD [facility]

## 2022-12-31 ENCOUNTER — Ambulatory Visit (INDEPENDENT_AMBULATORY_CARE_PROVIDER_SITE_OTHER): Payer: Medicaid Other | Admitting: Family Medicine

## 2022-12-31 ENCOUNTER — Encounter: Payer: Self-pay | Admitting: Family Medicine

## 2022-12-31 VITALS — BP 128/62 | HR 112 | Ht 67.0 in | Wt >= 6400 oz

## 2022-12-31 DIAGNOSIS — K7581 Nonalcoholic steatohepatitis (NASH): Secondary | ICD-10-CM

## 2022-12-31 DIAGNOSIS — E669 Obesity, unspecified: Secondary | ICD-10-CM

## 2022-12-31 DIAGNOSIS — Z9622 Myringotomy tube(s) status: Secondary | ICD-10-CM

## 2022-12-31 DIAGNOSIS — E1169 Type 2 diabetes mellitus with other specified complication: Secondary | ICD-10-CM | POA: Diagnosis not present

## 2022-12-31 DIAGNOSIS — Z23 Encounter for immunization: Secondary | ICD-10-CM

## 2022-12-31 DIAGNOSIS — H9211 Otorrhea, right ear: Secondary | ICD-10-CM

## 2022-12-31 DIAGNOSIS — K219 Gastro-esophageal reflux disease without esophagitis: Secondary | ICD-10-CM

## 2022-12-31 LAB — POCT GLYCOSYLATED HEMOGLOBIN (HGB A1C): HbA1c, POC (controlled diabetic range): 6.7 % (ref 0.0–7.0)

## 2022-12-31 MED ORDER — SEMAGLUTIDE(0.25 OR 0.5MG/DOS) 2 MG/1.5ML ~~LOC~~ SOPN: 0.2500 mg | PEN_INJECTOR | SUBCUTANEOUS | 3 refills | Status: DC

## 2022-12-31 MED ORDER — PANTOPRAZOLE SODIUM 40 MG PO TBEC: 40.0000 mg | DELAYED_RELEASE_TABLET | Freq: Every day | ORAL | 3 refills | Status: DC

## 2022-12-31 NOTE — Assessment & Plan Note (Signed)
Well controlled, has been out of Semaglutide for 6 months, will refer and discussed taper up to '1mg'$   Discussed signs/symptoms of pancreatitis to watch for F/u in 3 months, discussed urine albumin creatinine ratio and he is unable to leave urine sample today Given flu and COVID booster Checking lipid panel today, discussed he may qualify for statin with his diabetes but also with elevated LFTs will need to balance

## 2022-12-31 NOTE — Patient Instructions (Addendum)
It was wonderful to see you today.  Please bring ALL of your medications with you to every visit.   Today we talked about:  We gave you your flu shot and COVID booster.  We are checking your cholesterol and liver function.  I have sent a referral to GI for your NASH and diarrhea and GERD management.  I have placed the referral to ENT to go back to see about your ear tubes.  Thank you for choosing [facility].   Please call [phone] with any questions about today's appointment.  Please arrive at least 15 minutes prior to your scheduled appointments.   If you had blood work today, I will send you a MyChart message or a letter if results are normal. Otherwise, I will give you a call.   If you had a referral placed, they will call you to set up an appointment. Please give [place] a call if you don't hear back in the next 2 weeks.   If you need additional refills before your next appointment, please call your pharmacy first.   Yehuda Savannah, MD  Family Medicine

## 2022-12-31 NOTE — Assessment & Plan Note (Signed)
Switch to pantoprazole daily, referral to GI

## 2022-12-31 NOTE — Assessment & Plan Note (Signed)
Diagnosed by Dr Andria Frames, previously CT in 2015 showing fatty liver Repeat CMP to trend LFTs today, will also check acute hepatits panel (did have Hep C negative in 2021) Agree with referral to GI for rule out of other causes and imaging follow up recommendations

## 2022-12-31 NOTE — Assessment & Plan Note (Signed)
Ear tube in place on exam today, will refer to ENT per patient request

## 2023-01-01 LAB — ACUTE VIRAL HEPATITIS (HAV, HBV, HCV)
HCV Ab: NONREACTIVE
Hep A IgM: NEGATIVE
Hep B C IgM: NEGATIVE
Hepatitis B Surface Ag: NEGATIVE

## 2023-01-01 LAB — COMPREHENSIVE METABOLIC PANEL
ALT: 58 IU/L — ABNORMAL HIGH (ref 0–44)
AST: 36 IU/L (ref 0–40)
Albumin/Globulin Ratio: 1.4 (ref 1.2–2.2)
Albumin: 4.2 g/dL — ABNORMAL LOW (ref 4.3–5.2)
Alkaline Phosphatase: 55 IU/L (ref 44–121)
BUN/Creatinine Ratio: 12 (ref 9–20)
BUN: 12 mg/dL (ref 6–20)
Bilirubin Total: 0.4 mg/dL (ref 0.0–1.2)
CO2: 19 mmol/L — ABNORMAL LOW (ref 20–29)
Calcium: 9.5 mg/dL (ref 8.7–10.2)
Chloride: 99 mmol/L (ref 96–106)
Creatinine, Ser: 1.01 mg/dL (ref 0.76–1.27)
Globulin, Total: 3 g/dL (ref 1.5–4.5)
Glucose: 157 mg/dL — ABNORMAL HIGH (ref 70–99)
Potassium: 4.3 mmol/L (ref 3.5–5.2)
Sodium: 136 mmol/L (ref 134–144)
Total Protein: 7.2 g/dL (ref 6.0–8.5)
eGFR: 105 mL/min/{1.73_m2} (ref >59)

## 2023-01-01 LAB — LIPID PANEL
Chol/HDL Ratio: 5.2 ratio — ABNORMAL HIGH (ref 0.0–5.0)
Cholesterol, Total: 217 mg/dL — ABNORMAL HIGH (ref 100–199)
HDL: 42 mg/dL (ref >39)
LDL Chol Calc (NIH): 145 mg/dL — ABNORMAL HIGH (ref 0–99)
Triglycerides: 167 mg/dL — ABNORMAL HIGH (ref 0–149)
VLDL Cholesterol Cal: 30 mg/dL (ref 5–40)

## 2023-01-01 LAB — HCV INTERPRETATION

## 2023-01-02 ENCOUNTER — Telehealth: Payer: Self-pay

## 2023-01-02 NOTE — Telephone Encounter (Signed)
A Prior Authorization was initiated for this patients Ozempic through CoverMyMeds.   Key: [id number]

## 2023-01-03 ENCOUNTER — Telehealth: Payer: Self-pay | Admitting: Family Medicine

## 2023-01-03 DIAGNOSIS — E1169 Type 2 diabetes mellitus with other specified complication: Secondary | ICD-10-CM

## 2023-01-03 MED ORDER — METFORMIN HCL ER 500 MG PO TB24: ORAL_TABLET | ORAL | 1 refills | Status: DC

## 2023-01-03 NOTE — Telephone Encounter (Signed)
Called and discussed elevated LDL, and that even though he is young would recommend treatment for HLD due to his T2DM. Want to avoid statin due to his history of NASH and elevated LFTs. He is agreeable to referral to lipid clinic.   Discussed Lfts improved. Acute hepatitis panel pending. GI referral pending.  Discussed insurance not covering GLP-1, he is ok starting metformin. 500 mg once daily x 2 weeks, then increase to BID.  Answered all questions and concerns. He mentions wanting a male doctor, discussed calling clinic to request PCP switch.  Yehuda Savannah MD

## 2023-01-03 NOTE — Telephone Encounter (Signed)
Discussed with patient.  We will do metformin.

## 2023-01-03 NOTE — Telephone Encounter (Signed)
Prior Auth for patients medication OZEMPIC denied by [employer] via CoverMyMeds.   Reason: Per your health plan's criteria, this drug is covered if you meet the following: One of the following:  (A)You have tried and failed or did not respond fully to metformin containing drug.  (B) You cannot use metformin.  (C) You have a type of heart or kidney disease (atherosclerotic cardiovascular disease or chronic kidney disease).  CoverMyMeds Key: [id number]

## 2023-08-11 ENCOUNTER — Ambulatory Visit: Payer: Medicaid Other | Admitting: Family Medicine

## 2023-08-22 ENCOUNTER — Other Ambulatory Visit (HOSPITAL_COMMUNITY): Payer: Self-pay

## 2023-08-22 ENCOUNTER — Ambulatory Visit (INDEPENDENT_AMBULATORY_CARE_PROVIDER_SITE_OTHER): Payer: Medicaid Other | Admitting: Family Medicine

## 2023-08-22 ENCOUNTER — Encounter: Payer: Self-pay | Admitting: Family Medicine

## 2023-08-22 VITALS — BP 132/80 | HR 100 | Temp 98.7°F | Ht 67.0 in | Wt >= 6400 oz

## 2023-08-22 DIAGNOSIS — K219 Gastro-esophageal reflux disease without esophagitis: Secondary | ICD-10-CM

## 2023-08-22 DIAGNOSIS — Z23 Encounter for immunization: Secondary | ICD-10-CM

## 2023-08-22 DIAGNOSIS — G43809 Other migraine, not intractable, without status migrainosus: Secondary | ICD-10-CM

## 2023-08-22 DIAGNOSIS — K7581 Nonalcoholic steatohepatitis (NASH): Secondary | ICD-10-CM

## 2023-08-22 DIAGNOSIS — Z9622 Myringotomy tube(s) status: Secondary | ICD-10-CM

## 2023-08-22 DIAGNOSIS — H9211 Otorrhea, right ear: Secondary | ICD-10-CM

## 2023-08-22 DIAGNOSIS — R030 Elevated blood-pressure reading, without diagnosis of hypertension: Secondary | ICD-10-CM

## 2023-08-22 DIAGNOSIS — E1169 Type 2 diabetes mellitus with other specified complication: Secondary | ICD-10-CM | POA: Diagnosis not present

## 2023-08-22 DIAGNOSIS — E669 Obesity, unspecified: Secondary | ICD-10-CM

## 2023-08-22 LAB — POCT GLYCOSYLATED HEMOGLOBIN (HGB A1C): HbA1c, POC (controlled diabetic range): 6.9 % (ref 0.0–7.0)

## 2023-08-22 MED ORDER — SEMAGLUTIDE(0.25 OR 0.5MG/DOS) 2 MG/3ML ~~LOC~~ SOPN
0.5000 mg | PEN_INJECTOR | SUBCUTANEOUS | 0 refills | Status: DC
  Filled 2023-08-22 – 2023-09-15 (×2): qty 3, 28d supply, fill #0
  Filled 2023-09-17: qty 3, 42d supply, fill #0
  Filled 2023-09-17 – 2023-09-24 (×2): qty 3, 28d supply, fill #0

## 2023-08-22 MED ORDER — METOPROLOL SUCCINATE ER 25 MG PO TB24
25.0000 mg | ORAL_TABLET | Freq: Every day | ORAL | 0 refills | Status: DC
  Filled 2023-08-22 – 2023-08-25 (×2): qty 90, 90d supply, fill #0

## 2023-08-22 MED ORDER — METFORMIN HCL ER 500 MG PO TB24
ORAL_TABLET | ORAL | 1 refills | Status: DC
  Filled 2023-08-22: qty 166, 90d supply, fill #0
  Filled 2023-08-25: qty 180, 90d supply, fill #0
  Filled 2023-11-28 (×2): qty 180, 90d supply, fill #1

## 2023-08-22 NOTE — Patient Instructions (Addendum)
It was great to see you!  Our plans for today:  - Take the semaglutide 0.25mg  for 1 month. Then start the 0.5mg  dose sent to the pharmacy.  - Start the metoprolol for your headaches. - See below for tips on healthy diet and exercise.  - Come back in 1 month.  We are checking some labs today, we will release these results to your MyChart.  Take care and seek immediate care sooner if you develop any concerns.   Dr. Linwood Dibbles   Here is an example of what a healthy plate looks like:    ? Make half your plate fruits and vegetables.     ? Focus on whole fruits.     ? Vary your veggies.  ? Make half your grains whole grains. -     ? Look for the word "whole" at the beginning of the ingredients list    ? Some whole-grain ingredients include whole oats, whole-wheat flour,        whole-grain corn, whole-grain brown rice, and whole rye.  ? Move to low-fat and fat-free milk or yogurt.  ? Vary your protein routine. - Meat, fish, poultry (chicken, [place]), eggs, beans (kidney, pinto), dairy.  ? Drink and eat less sodium, saturated fat, and added sugars.    Diet Recommendations for Diabetes   1. Eat at least 3 meals and 1-2 snacks per day. Never go more than 4-5 hours while awake without eating. Eat breakfast within the first hour of getting up.   2. Limit starchy foods to TWO per meal and ONE per snack. ONE portion of a starchy  food is equal to the following:   - ONE slice of bread (or its equivalent, such as half of a hamburger bun).   - 1/2 cup of a "scoopable" starchy food such as potatoes or rice.   - 15 grams of Total Carbohydrate as shown on food label.  3. Include at every meal: a protein food, a carb food, and vegetables and/or fruit.   - Obtain twice the volume of vegetables as protein or carbohydrate foods for both lunch and dinner.   - Fresh or frozen vegetables are best.   - Keep frozen vegetables on hand for a quick vegetable serving.       Starchy (carb) foods: Bread,  rice, pasta, potatoes, corn, cereal, grits, crackers, bagels, muffins, all baked goods.  (Fruits, milk, and yogurt also have carbohydrate, but most of these foods will not spike your blood sugar as most starchy foods will.)  A few fruits do cause high blood sugars; use small portions of bananas (limit to 1/2 at a time), grapes, watermelon, oranges, and most tropical fruits.    Protein foods: Meat, fish, poultry, eggs, dairy foods, and beans such as pinto and kidney beans (beans also provide carbohydrate).      Look for opportunities to move your body throughout your day:  Never lie down when you can sit; never sit when you can stand; never stand when you can pace.  Moving your body throughout the day is just as important as the 30 or 60 minutes of exercise at the gym!  Get social Get active with your friends instead of going out to eat. Go for a hike, walk around the mall, or play an exercise-themed video game.   Move more at work Fit more activity into the workday. Stand during phone calls, use a printer farther from your desk, and get up to stretch each hour.  Do something new Develop a new skill to kick-start your motivation. Sign up for a class to learn how to [employer], surf, do tai chi, or play a sport.    Keep cool in the pool Don't like to sweat? Hit the local community pool for a swim, water polo, or water aerobics class to stay cool while exercising.    Stay on track Use a fitness tracker (FITBIT, Fitness Pal mobile app) to track your activity and provide motivation to reach your goals.

## 2023-08-22 NOTE — Progress Notes (Unsigned)
   SUBJECTIVE:   CHIEF COMPLAINT / HPI:   Diabetes, Type 2 - Last A1c 6.7 12/2022 - Medications: ozempic, metformin - Compliance: hasn't been on any medication for months. Tolerated ok when on it. Thinks he had insurance issues getting the ozempic  - Checking BG at home: no - Exercise: none - Eye exam: UTD - Foot exam: due - Microalbumin: due - Statin: no - some polyuria, polydipsia, numbness/tingling - Denies foot ulcers/trauma  GERD - pantoprazole. Helps. Has symptoms when doesn't take it.  Migraine - intermittently 2-3 days per week. Ibuprofen helps some. Sometimes keeps from normal activities. Bilateral frontal. Lasts whole day. +photophobia, phonophobia. Previously on metoprolol for prophylaxis.  Ear pain - R ear bothering him. Still has tympanostomy tube in place. Sometimes drainage, some pain/irritation. Previously saw Dr. Molli Barrows with ENT, hasn't seen in some time.   OBJECTIVE:   BP 132/80   Pulse 100   Temp 98.7 F (37.1 C)   Ht 5\' 7"  (1.702 m)   Wt (!) 485 lb (220 kg)   SpO2 98%   BMI 75.96 kg/m   Gen: well appearing, obese, in NAD HEENT: TM visible b/l without bulging, erythema, purulence. R tympanostomy tube visible and appears to be falling out with edge touching external canal.  Card: RRR Lungs: CTAB Ext: WWP, no edema. Foot exam performed.   Diabetic Foot Exam - Simple   Simple Foot Form Diabetic Foot exam was performed with the following findings: Yes 08/22/2023 11:27 AM  Visual Inspection No deformities, no ulcerations, no other skin breakdown bilaterally: Yes Sensation Testing Intact to touch and monofilament testing bilaterally: Yes See comments: Yes Pulse Check Posterior Tibialis and Dorsalis pulse intact bilaterally: Yes Comments Slight decreased sensation to heel overlying callous, otherwise wnl.      ASSESSMENT/PLAN:   Migraine variant Start metoprolol for prophylaxis. Hope to decrease BP as well. Continue ibuprofen prn. F/u 1 month.    NASH (nonalcoholic steatohepatitis) Previously seen on CT with prior negative hepatitis panel. Will obtain CMP today to trend LFTs. Consider iron panel at followup to further workup if needed. Weight loss will be of tremendous benefit.  Type 2 diabetes mellitus with obesity (HCC) Recheck A1c today. Refill metformin, ozempic. Given insurance difficulties in the past, starting dose sample provided today with 0.5mg  sent to pharmacy. Foot exam performed. Obtain urine ACR. F/u 1 month.   Otorrhea of right ear Intermittent with irritation. R tympanostomy tube still present though appears to be falling out and edge touching skin of external auditory canal, unsure if this is cause of irritation. No discharge or lesions seen on exam. Referred to ENT.  Morbid obesity with body mass index of 50 or higher Contributing to diabetes, NASH, GERD, HTN and continues to gain weight. Discussed need for weight loss. Ozempic and metformin restarted, titrate as tolerable. Handout on diet and exercise given. Will likely need extensive medical or surgical weight management.     F/u 1 month.  Caro Laroche, DO

## 2023-08-23 ENCOUNTER — Encounter: Payer: Self-pay | Admitting: Family Medicine

## 2023-08-23 LAB — CBC
Hematocrit: 45 % (ref 37.5–51.0)
Hemoglobin: 14.5 g/dL (ref 13.0–17.7)
MCH: 26.7 pg (ref 26.6–33.0)
MCHC: 32.2 g/dL (ref 31.5–35.7)
MCV: 83 fL (ref 79–97)
Platelets: 324 10*3/uL (ref 150–450)
RBC: 5.43 x10E6/uL (ref 4.14–5.80)
RDW: 13.2 % (ref 11.6–15.4)
WBC: 8.3 10*3/uL (ref 3.4–10.8)

## 2023-08-23 LAB — COMPREHENSIVE METABOLIC PANEL
ALT: 46 [IU]/L — ABNORMAL HIGH (ref 0–44)
AST: 28 [IU]/L (ref 0–40)
Albumin: 4 g/dL — ABNORMAL LOW (ref 4.3–5.2)
Alkaline Phosphatase: 55 [IU]/L (ref 44–121)
BUN/Creatinine Ratio: 15 (ref 9–20)
BUN: 15 mg/dL (ref 6–20)
Bilirubin Total: 0.3 mg/dL (ref 0.0–1.2)
CO2: 21 mmol/L (ref 20–29)
Calcium: 9.4 mg/dL (ref 8.7–10.2)
Chloride: 101 mmol/L (ref 96–106)
Creatinine, Ser: 1 mg/dL (ref 0.76–1.27)
Globulin, Total: 3 g/dL (ref 1.5–4.5)
Glucose: 173 mg/dL — ABNORMAL HIGH (ref 70–99)
Potassium: 4.3 mmol/L (ref 3.5–5.2)
Sodium: 138 mmol/L (ref 134–144)
Total Protein: 7 g/dL (ref 6.0–8.5)
eGFR: 106 mL/min/{1.73_m2} (ref >59)

## 2023-08-23 NOTE — Assessment & Plan Note (Signed)
Contributing to diabetes, NASH, GERD, HTN and continues to gain weight. Discussed need for weight loss. Ozempic and metformin restarted, titrate as tolerable. Handout on diet and exercise given. Will likely need extensive medical or surgical weight management.

## 2023-08-23 NOTE — Assessment & Plan Note (Signed)
Intermittent with irritation. R tympanostomy tube still present though appears to be falling out and edge touching skin of external auditory canal, unsure if this is cause of irritation. No discharge or lesions seen on exam. Referred to ENT.

## 2023-08-23 NOTE — Assessment & Plan Note (Signed)
Recheck A1c today. Refill metformin, ozempic. Given insurance difficulties in the past, starting dose sample provided today with 0.5mg  sent to pharmacy. Foot exam performed. Obtain urine ACR. F/u 1 month.

## 2023-08-23 NOTE — Assessment & Plan Note (Addendum)
Previously seen on CT with prior negative hepatitis panel. Will obtain CMP today to trend LFTs. Consider iron panel at followup to further workup if needed. Weight loss will be of tremendous benefit.

## 2023-08-23 NOTE — Assessment & Plan Note (Signed)
Start metoprolol for prophylaxis. Hope to decrease BP as well. Continue ibuprofen prn. F/u 1 month.

## 2023-08-25 ENCOUNTER — Other Ambulatory Visit (HOSPITAL_COMMUNITY): Payer: Self-pay

## 2023-09-15 ENCOUNTER — Other Ambulatory Visit (HOSPITAL_COMMUNITY): Payer: Self-pay

## 2023-09-17 ENCOUNTER — Telehealth: Payer: Self-pay

## 2023-09-17 ENCOUNTER — Other Ambulatory Visit (HOSPITAL_COMMUNITY): Payer: Self-pay

## 2023-09-17 NOTE — Telephone Encounter (Signed)
-----   Message from Caro Laroche sent at 09/17/2023  2:29 PM EST ----- [facility] team - please let patient know he should be able to get his ozempic from the pharmacy given he has tried metformin. If PA is required, to ask pharmacy to send PA to [place].  Thank you! ----- Message ----- From: Otho Najjar, CPhT Sent: 09/17/2023  10:43 AM EST To: Caro Laroche, DO  Correct, if PA rec'd they will need the metformin failure most likely! ----- Message ----- From: Caro Laroche, DO Sent: 09/15/2023  10:59 AM EST To: Otho Najjar, CPhT  He was started on metformin after the last denial in March and I refilled at his last appt. He is supposed to be following up soon and I can document toleration. I did give him samples and send in the 0.5mg  of semaglutide so hopefully we'll be seeing a PA soon if needed? ----- Message ----- From: Otho Najjar, CPhT Sent: 09/12/2023   2:51 PM EST To: Caro Laroche, DO  Last attempt was 01/02/23 and insurance denied due to no trial and failure of metformin. I havent seen any new requests come through yet. Has he failed metformin? ----- Message ----- From: Caro Laroche, DO Sent: 09/11/2023   9:01 AM EST To: Otho Najjar, CPhT  Hey!  Do you have any information on whether we have received a prior auth on this patient's semaglutide?  Thanks!

## 2023-09-17 NOTE — Telephone Encounter (Signed)
Rec'd PA request for patients Ozempic.   Medication previously denied due to no failure of metformin.   Could possibly try [facility] for weight loss, once appropriate documentation is given.

## 2023-09-17 NOTE — Telephone Encounter (Signed)
Called patient to inform him per Dr. Madelaine Etienne request about his medication ozempic and that he should be able to pick it up from pharmacy  Did inform his as well if PA is required to ask the pharmacy to send PA to [place].  Drusilla Kanner, CMA

## 2023-09-24 ENCOUNTER — Other Ambulatory Visit (HOSPITAL_COMMUNITY): Payer: Self-pay

## 2023-09-24 NOTE — Telephone Encounter (Signed)
Mother returns call to nurse line regarding PA being needed for Ozempic.   Unsure how patient is to proceed, as we would need documentation of metformin failure to proceed with Ozempic PA.   Please advise if you would like to prescribe Wegovy as alternative or have patient schedule follow up for metformin tolerance.   Veronda Prude, RN

## 2023-09-24 NOTE — Telephone Encounter (Signed)
LVM for patient to call and schedule an appointment to follow up on Metformin failure.  Glennie Hawk, CMA

## 2023-09-25 NOTE — Telephone Encounter (Signed)
Will see patient in f/u and document metformin failure.

## 2023-11-24 ENCOUNTER — Encounter: Payer: Self-pay | Admitting: Family Medicine

## 2023-11-24 ENCOUNTER — Other Ambulatory Visit (HOSPITAL_COMMUNITY): Payer: Self-pay

## 2023-11-24 ENCOUNTER — Ambulatory Visit (INDEPENDENT_AMBULATORY_CARE_PROVIDER_SITE_OTHER): Payer: Medicaid Other | Admitting: Family Medicine

## 2023-11-24 ENCOUNTER — Other Ambulatory Visit: Payer: Self-pay

## 2023-11-24 VITALS — BP 121/87 | HR 86 | Ht 67.0 in | Wt >= 6400 oz

## 2023-11-24 DIAGNOSIS — K7581 Nonalcoholic steatohepatitis (NASH): Secondary | ICD-10-CM | POA: Diagnosis not present

## 2023-11-24 DIAGNOSIS — E669 Obesity, unspecified: Secondary | ICD-10-CM

## 2023-11-24 DIAGNOSIS — E1169 Type 2 diabetes mellitus with other specified complication: Secondary | ICD-10-CM

## 2023-11-24 LAB — POCT GLYCOSYLATED HEMOGLOBIN (HGB A1C): HbA1c, POC (controlled diabetic range): 6.5 % (ref 0.0–7.0)

## 2023-11-24 MED ORDER — SEMAGLUTIDE(0.25 OR 0.5MG/DOS) 2 MG/3ML ~~LOC~~ SOPN
0.5000 mg | PEN_INJECTOR | SUBCUTANEOUS | 0 refills | Status: DC
  Filled 2023-11-24: qty 3, 30d supply, fill #0
  Filled 2024-01-16 – 2024-02-20 (×2): qty 3, 28d supply, fill #0
  Filled 2024-02-20: qty 3, 56d supply, fill #0

## 2023-11-24 NOTE — Assessment & Plan Note (Signed)
Diet and exercise discussed. Reinitiating ozempic for diabetic control. Refer to healthy weight and wellness.

## 2023-11-24 NOTE — Patient Instructions (Addendum)
It was great to see you!  Our plans for today:  - We are going to try to get you ozempic again.  - Make an appointment soon to see your eye doctor. - We are referring you to weight management. Let us know if you don't hear about an appointment in the next few weeks.   We are checking some labs today, we will release these results to your MyChart.  Take care and seek immediate care sooner if you develop any concerns.   Dr. Linwood Dibbles

## 2023-11-24 NOTE — Assessment & Plan Note (Signed)
Recheck labs. Obtain iron panel.

## 2023-11-24 NOTE — Assessment & Plan Note (Addendum)
Uncontrolled despite metformin. Will try ozempic. Sample provided today, 0.5mg  dose sent to pharmacy. Encouraged to make appt for eye exam. UACR today. F/u 2 months.

## 2023-11-24 NOTE — Progress Notes (Signed)
   SUBJECTIVE:   CHIEF COMPLAINT / HPI:   Diabetes, Type 2 - Last A1c 6.9 08/2023 - Medications: metformin, ozempic - Compliance: hasn't been able to get ozempic.  - Checking BG at home: no - Diet: 1 meal per day. Meat, potatoes, vegetables. Had spaghetti last night.  - Exercise: walking about 1 mile per day.  - Eye exam: due - Foot exam: UTD - Microalbumin: due - Denies symptoms of hypoglycemia, polyuria, polydipsia, numbness extremities, foot ulcers/trauma   OBJECTIVE:   BP 121/87   Pulse 86   Ht 5\' 7"  (1.702 m)   Wt (!) 478 lb (216.8 kg)   SpO2 100%   BMI 74.87 kg/m   Gen: well appearing, in NAD Card: Reg rate Lungs: Comfortable WOB on RA Ext: WWP   ASSESSMENT/PLAN:   Type 2 diabetes mellitus with obesity (HCC) Uncontrolled despite metformin. Will try ozempic. Sample provided today, 0.5mg  dose sent to pharmacy. Encouraged to make appt for eye exam. UACR today. F/u 2 months.  NASH (nonalcoholic steatohepatitis) Recheck labs. Obtain iron panel.   Morbid obesity with body mass index of 50 or higher Diet and exercise discussed. Reinitiating ozempic for diabetic control. Refer to healthy weight and wellness.   F/u 2 months for diabetic f/u and PHQ.  Caro Laroche, DO

## 2023-11-25 ENCOUNTER — Other Ambulatory Visit: Payer: Self-pay

## 2023-11-28 ENCOUNTER — Other Ambulatory Visit: Payer: Self-pay | Admitting: Family Medicine

## 2023-11-28 ENCOUNTER — Other Ambulatory Visit (HOSPITAL_COMMUNITY): Payer: Self-pay

## 2023-11-28 DIAGNOSIS — K219 Gastro-esophageal reflux disease without esophagitis: Secondary | ICD-10-CM

## 2023-11-29 ENCOUNTER — Other Ambulatory Visit (HOSPITAL_COMMUNITY): Payer: Self-pay

## 2023-11-29 MED ORDER — PANTOPRAZOLE SODIUM 40 MG PO TBEC
40.0000 mg | DELAYED_RELEASE_TABLET | Freq: Every day | ORAL | 3 refills | Status: DC
  Filled 2023-11-29: qty 90, 90d supply, fill #0
  Filled 2024-03-03: qty 90, 90d supply, fill #1
  Filled 2024-06-03: qty 90, 90d supply, fill #2
  Filled 2024-08-30: qty 90, 90d supply, fill #3

## 2023-11-29 MED ORDER — METOPROLOL SUCCINATE ER 25 MG PO TB24
25.0000 mg | ORAL_TABLET | Freq: Every day | ORAL | 1 refills | Status: DC
  Filled 2023-11-29: qty 90, 90d supply, fill #0

## 2023-11-30 LAB — COMPREHENSIVE METABOLIC PANEL
ALT: 50 [IU]/L — ABNORMAL HIGH (ref 0–44)
AST: 30 [IU]/L (ref 0–40)
Albumin: 4.4 g/dL (ref 4.3–5.2)
Alkaline Phosphatase: 56 [IU]/L (ref 44–121)
BUN/Creatinine Ratio: 13 (ref 9–20)
BUN: 14 mg/dL (ref 6–20)
Bilirubin Total: 0.3 mg/dL (ref 0.0–1.2)
CO2: 20 mmol/L (ref 20–29)
Calcium: 9.1 mg/dL (ref 8.7–10.2)
Chloride: 103 mmol/L (ref 96–106)
Creatinine, Ser: 1.07 mg/dL (ref 0.76–1.27)
Globulin, Total: 2.9 g/dL (ref 1.5–4.5)
Glucose: 146 mg/dL — ABNORMAL HIGH (ref 70–99)
Potassium: 4.7 mmol/L (ref 3.5–5.2)
Sodium: 142 mmol/L (ref 134–144)
Total Protein: 7.3 g/dL (ref 6.0–8.5)
eGFR: 98 mL/min/{1.73_m2} (ref >59)

## 2023-11-30 LAB — IRON,TIBC AND FERRITIN PANEL
Ferritin: 163 ng/mL (ref 30–400)
Iron Saturation: 16 % (ref 15–55)
Iron: 54 ug/dL (ref 38–169)
Total Iron Binding Capacity: 331 ug/dL (ref 250–450)
UIBC: 277 ug/dL (ref 111–343)

## 2023-11-30 LAB — MICROALBUMIN / CREATININE URINE RATIO

## 2023-12-01 ENCOUNTER — Other Ambulatory Visit: Payer: Self-pay

## 2023-12-01 ENCOUNTER — Other Ambulatory Visit (HOSPITAL_COMMUNITY): Payer: Self-pay

## 2023-12-02 ENCOUNTER — Encounter: Payer: Self-pay | Admitting: Family Medicine

## 2023-12-02 ENCOUNTER — Other Ambulatory Visit (HOSPITAL_COMMUNITY): Payer: Self-pay

## 2023-12-02 ENCOUNTER — Other Ambulatory Visit: Payer: Self-pay

## 2023-12-10 ENCOUNTER — Other Ambulatory Visit (HOSPITAL_COMMUNITY): Payer: Self-pay

## 2023-12-10 ENCOUNTER — Other Ambulatory Visit: Payer: Self-pay

## 2023-12-11 ENCOUNTER — Other Ambulatory Visit (HOSPITAL_COMMUNITY): Payer: Self-pay

## 2024-01-16 ENCOUNTER — Other Ambulatory Visit: Payer: Self-pay

## 2024-01-16 ENCOUNTER — Other Ambulatory Visit (HOSPITAL_COMMUNITY): Payer: Self-pay

## 2024-01-22 ENCOUNTER — Other Ambulatory Visit (HOSPITAL_COMMUNITY): Payer: Self-pay

## 2024-02-20 ENCOUNTER — Other Ambulatory Visit (HOSPITAL_COMMUNITY): Payer: Self-pay

## 2024-02-20 ENCOUNTER — Telehealth: Payer: Self-pay

## 2024-02-20 NOTE — Telephone Encounter (Signed)
 Pharmacy Patient Advocate Encounter   Received notification from CoverMyMeds that prior authorization for OZEMPIC  is required/requested.   Insurance verification completed.   The patient is insured through [facility] MEDICAID .   PA required; PA submitted to above mentioned insurance via CoverMyMeds Key/confirmation #/[id number]. Status is pending

## 2024-02-21 ENCOUNTER — Other Ambulatory Visit (HOSPITAL_COMMUNITY): Payer: Self-pay

## 2024-02-26 ENCOUNTER — Other Ambulatory Visit: Payer: Self-pay

## 2024-02-26 ENCOUNTER — Ambulatory Visit: Admitting: Family Medicine

## 2024-02-26 ENCOUNTER — Other Ambulatory Visit (HOSPITAL_COMMUNITY): Payer: Self-pay

## 2024-02-26 ENCOUNTER — Encounter: Payer: Self-pay | Admitting: Family Medicine

## 2024-02-26 VITALS — BP 116/84 | HR 106 | Ht 67.0 in | Wt >= 6400 oz

## 2024-02-26 DIAGNOSIS — G43809 Other migraine, not intractable, without status migrainosus: Secondary | ICD-10-CM

## 2024-02-26 DIAGNOSIS — F322 Major depressive disorder, single episode, severe without psychotic features: Secondary | ICD-10-CM | POA: Diagnosis not present

## 2024-02-26 DIAGNOSIS — E1169 Type 2 diabetes mellitus with other specified complication: Secondary | ICD-10-CM | POA: Diagnosis not present

## 2024-02-26 DIAGNOSIS — E669 Obesity, unspecified: Secondary | ICD-10-CM | POA: Diagnosis not present

## 2024-02-26 LAB — POCT GLYCOSYLATED HEMOGLOBIN (HGB A1C): HbA1c, POC (controlled diabetic range): 6.1 % (ref 0.0–7.0)

## 2024-02-26 MED ORDER — HYDROXYZINE HCL 25 MG PO TABS
25.0000 mg | ORAL_TABLET | Freq: Every evening | ORAL | 0 refills | Status: DC | PRN
  Filled 2024-02-26: qty 30, 30d supply, fill #0

## 2024-02-26 MED ORDER — METOPROLOL SUCCINATE ER 50 MG PO TB24
50.0000 mg | ORAL_TABLET | Freq: Every day | ORAL | 0 refills | Status: DC
Start: 2024-02-26 — End: 2024-06-03
  Filled 2024-02-26: qty 90, 90d supply, fill #0

## 2024-02-26 MED ORDER — SEMAGLUTIDE(0.25 OR 0.5MG/DOS) 2 MG/3ML ~~LOC~~ SOPN
0.5000 mg | PEN_INJECTOR | SUBCUTANEOUS | 0 refills | Status: DC
  Filled 2024-02-26 – 2024-05-21 (×2): qty 3, 28d supply, fill #0

## 2024-02-26 NOTE — Patient Instructions (Addendum)
 It was great to see you!  Our plans for today:  - Monitor your blood pressure at home and keep a log of your readings. Make sure to be seated for at least 5 minutes prior to testing and not in pain or worked up for the most accurate readings. Bring this log with you to follow up.  - We are increasing your metoprolol  to 50mg  for your headaches. - Go down on your ozempic  to 0.5mg . - See below for counseling resources. - Try a wrist splint for your pain. Wear this at night.     Take care and seek immediate care sooner if you develop any concerns.   Dr. Shonica Weier    Therapy and Counseling Resources Most providers on this list will take Medicaid. Patients with commercial insurance or Medicare should contact their insurance company to get a list of in network providers.  [facility] (takes children) Location 1: [address] [place], [place] [address] Location 2: [address] [place], [place] [address] [phone]   [facility] (spanish speaking therapist available)(habla espanol)(take medicare and medicaid)  [address], [place], [place] [address], [place]  [email or web address] [phone]  [facility]ing [address] [place], [place] [address] [phone]  [facility]   [address], Suite [place], [place] [address]      [phone]  [facility] (spanish available) [address]  [place], [place] [address] [phone]  [facility] (take [facility] and medicare) [address]  [place], [place] [address]       [phone]     [facility] (virtual only) [phone]  [facility] [address] [place], [place] [phone]  [facility]:  [address] [place] [phone]  [facility]:  [address] [place] [phone]  [facility] (under & uninsured) [address]   [place] [phone]    [email or web address]    [facility] [address]  [place]    [phone]  [facility] -[address]     Phone:  [phone]     [facility]-  [address]  [phone]   [facility] [address]      [place], [place] [phone]  [facility]: [address] [place], [place], [place]  [phone]   [facility] (Spanish therapist) [email or web address]  [address] [place]  [address]   [place] [address]    [phone]    [facility]   [address],  [place], [place]  [phone]   [facility]  [address] [place]  [phone]  [facility]  [address] [place], [place]        [phone]  Open Access/Walk In Clinic under & uninsured  [facility]  [address] [place], [place] [phone] Crisis [phone]  [facility] [address],  (Spanish)   [address] , [place]: [phone]) 8:30 - 12; 1 - 2:30  [facility] [employer],  [address] [place], [place]    [phone]):8:30 - 12; 2 - 3PM  [facility] [employer],  [address]  [place]; [phone]):   Mon - Fri 8 AM - 5 PM  [facility] [address] [place]  MWF 12:30 to 3:00 or call to schedule an appointment  [phone]  Specific Provider options Psychology Today  [email or web address]/us  click on find a therapist  enter your zip code left side and select or tailor a therapist for your specific need.   [facility] Provider Directory [email or web address]  (Medicaid)   Follow all drop down to find a provider  Social Support program [facility] [place] [phone] or Photo[employer] [address], [place], [place]  Recovery support and educational   24- Hour Availability:   [facility]  [address] [place], [place] [phone] Crisis [phone]  [facility] [phone]  [facility]  [phone]   [facility]  [phone] (after hours)  Therapeutic Alternative/Mobile Crisis   [phone]  [place]  National Suicide Hotline  [phone] Derrel Flies)  Call 911 or go to emergency room  [facility]  [phone]);  [place] and [employer]  [phone]); [place], [place], [place], [place], [place], [place], [place]

## 2024-02-26 NOTE — Assessment & Plan Note (Signed)
 Still with weekly migraines, increase metoprolol .

## 2024-02-26 NOTE — Progress Notes (Signed)
   SUBJECTIVE:   CHIEF COMPLAINT / HPI:   Diabetes, Type 2 - Last A1c 6.5 11/2023 - Medications: metformin , ozempic  - Compliance: good - Checking BG at home: no - Exercise: walking about 1 mile per day sometimes - Eye exam: due - Foot exam: UTD - Microalbumin: UTD - Some nausea, vomiting, diarrhea.  Is intermittent and attributes to ongoing GI issues.  Unsure if Ozempic  is contributing.  Depression - Medications: none. previously on prozac , effexor , abilify . - h/o self-cutting, SI, HI. - Taking: n/a - Counseling: no - Previous hospitalizations: no. Prior ED visits for SI/HI, evaluated for admission but after rooming in ED for 2 days, discharged on effexor  by telepsych. - FH of psych illness: mom with bipolar - Symptoms: feeling down, avoidance of people - Current stressors: current health, mom's current health - Coping Mechanisms: sleeping  Migraines - metoprolol  helping. Once per week, lasting whole day. Doesn't take anything for abortive therapy.  OBJECTIVE:   BP 116/84   Pulse (!) 106   Ht 5\' 7"  (1.702 m)   Wt (!) 468 lb (212.3 kg)   SpO2 98%   BMI 73.30 kg/m   Gen: morbidly obese, in NAD Card: Reg rate Lungs: Comfortable WOB on RA Ext: WWP   ASSESSMENT/PLAN:   Migraine variant Still with weekly migraines, increase metoprolol .  Type 2 diabetes mellitus with obesity (HCC) A1c at goal. Decrease ozempic  due to GI symptoms. F/u 1 month.  Morbid obesity with body mass index of 50 or higher Weight down 10lbs since last visit 3 months ago. With some GI symptoms, decrease ozempic  to 0.5mg . continue to work on diet and exercise, unfortunately depression and anhedonia limits activity.  Severe major depression without psychotic features (HCC) PHQ elevated today. With some easy irritability and difficulty sleeping. Discussed medication, counseling. Wants to think about counseling. Will trial atarax for irritability, sleep. Resources provided. If initiating medication at  f/u visit, consider abilify  given strong FH of bipolar. F/u 1 month.    Kandis Ormond, DO

## 2024-02-26 NOTE — Assessment & Plan Note (Signed)
 A1c at goal. Decrease ozempic  due to GI symptoms. F/u 1 month.

## 2024-02-26 NOTE — Assessment & Plan Note (Addendum)
 PHQ elevated today. With some easy irritability and difficulty sleeping. Discussed medication, counseling. Wants to think about counseling. Will trial atarax for irritability, sleep. Resources provided. If initiating medication at f/u visit, consider abilify  given strong FH of bipolar. F/u 1 month.

## 2024-02-26 NOTE — Assessment & Plan Note (Signed)
 Weight down 10lbs since last visit 3 months ago. With some GI symptoms, decrease ozempic  to 0.5mg . continue to work on diet and exercise, unfortunately depression and anhedonia limits activity.

## 2024-03-03 ENCOUNTER — Other Ambulatory Visit: Payer: Self-pay | Admitting: Family Medicine

## 2024-03-03 ENCOUNTER — Other Ambulatory Visit: Payer: Self-pay

## 2024-03-03 ENCOUNTER — Other Ambulatory Visit (HOSPITAL_COMMUNITY): Payer: Self-pay

## 2024-03-03 MED ORDER — METFORMIN HCL ER 500 MG PO TB24
500.0000 mg | ORAL_TABLET | Freq: Two times a day (BID) | ORAL | 3 refills | Status: AC
  Filled 2024-03-03: qty 180, 90d supply, fill #0
  Filled 2024-06-03: qty 180, 90d supply, fill #1
  Filled 2024-08-30: qty 180, 90d supply, fill #2
  Filled 2024-11-11: qty 180, 90d supply, fill #3

## 2024-03-03 NOTE — Telephone Encounter (Signed)
 Pharmacy Patient Advocate Encounter  Received notification from [facility] MEDICAID that Prior Authorization for OZEMPIC  0.25/0.5MG  has been APPROVED from 02/20/24 to 02/19/25   PA #/Case ID/Reference #: [id number]

## 2024-03-05 ENCOUNTER — Other Ambulatory Visit: Payer: Self-pay

## 2024-03-05 ENCOUNTER — Encounter (HOSPITAL_COMMUNITY): Payer: Self-pay | Admitting: *Deleted

## 2024-03-05 ENCOUNTER — Other Ambulatory Visit (HOSPITAL_COMMUNITY): Payer: Self-pay

## 2024-03-05 ENCOUNTER — Telehealth: Payer: Self-pay | Admitting: Student

## 2024-03-05 ENCOUNTER — Emergency Department (HOSPITAL_COMMUNITY)
Admission: EM | Admit: 2024-03-05 | Discharge: 2024-03-05 | Disposition: A | Discharge status: Home / Self Care | Attending: Emergency Medicine | Admitting: Emergency Medicine

## 2024-03-05 DIAGNOSIS — Z79899 Other long term (current) drug therapy: Secondary | ICD-10-CM | POA: Diagnosis not present

## 2024-03-05 DIAGNOSIS — Z7984 Long term (current) use of oral hypoglycemic drugs: Secondary | ICD-10-CM | POA: Diagnosis not present

## 2024-03-05 DIAGNOSIS — R1084 Generalized abdominal pain: Secondary | ICD-10-CM | POA: Diagnosis not present

## 2024-03-05 DIAGNOSIS — R0689 Other abnormalities of breathing: Secondary | ICD-10-CM | POA: Diagnosis not present

## 2024-03-05 DIAGNOSIS — R197 Diarrhea, unspecified: Secondary | ICD-10-CM | POA: Diagnosis not present

## 2024-03-05 DIAGNOSIS — E119 Type 2 diabetes mellitus without complications: Secondary | ICD-10-CM | POA: Diagnosis not present

## 2024-03-05 DIAGNOSIS — R112 Nausea with vomiting, unspecified: Secondary | ICD-10-CM | POA: Diagnosis not present

## 2024-03-05 DIAGNOSIS — R1013 Epigastric pain: Secondary | ICD-10-CM | POA: Insufficient documentation

## 2024-03-05 DIAGNOSIS — I10 Essential (primary) hypertension: Secondary | ICD-10-CM | POA: Insufficient documentation

## 2024-03-05 LAB — CBC WITH DIFFERENTIAL/PLATELET
Abs Immature Granulocytes: 0.04 10*3/uL (ref 0.00–0.07)
Basophils Absolute: 0 10*3/uL (ref 0.0–0.1)
Basophils Relative: 0 %
Eosinophils Absolute: 0.3 10*3/uL (ref 0.0–0.5)
Eosinophils Relative: 3 %
HCT: 43.8 % (ref 39.0–52.0)
Hemoglobin: 14.5 g/dL (ref 13.0–17.0)
Immature Granulocytes: 0 %
Lymphocytes Relative: 26 %
Lymphs Abs: 2.4 10*3/uL (ref 0.7–4.0)
MCH: 27 pg (ref 26.0–34.0)
MCHC: 33.1 g/dL (ref 30.0–36.0)
MCV: 81.6 fL (ref 80.0–100.0)
Monocytes Absolute: 0.6 10*3/uL (ref 0.1–1.0)
Monocytes Relative: 6 %
Neutro Abs: 6.1 10*3/uL (ref 1.7–7.7)
Neutrophils Relative %: 65 %
Platelets: 337 10*3/uL (ref 150–400)
RBC: 5.37 MIL/uL (ref 4.22–5.81)
RDW: 13.2 % (ref 11.5–15.5)
WBC: 9.4 10*3/uL (ref 4.0–10.5)
nRBC: 0 % (ref 0.0–0.2)

## 2024-03-05 LAB — CBG MONITORING, ED: Glucose-Capillary: 129 mg/dL — ABNORMAL HIGH (ref 70–99)

## 2024-03-05 LAB — COMPREHENSIVE METABOLIC PANEL WITH GFR
ALT: 44 U/L (ref 0–44)
AST: 34 U/L (ref 15–41)
Albumin: 3.1 g/dL — ABNORMAL LOW (ref 3.5–5.0)
Alkaline Phosphatase: 37 U/L — ABNORMAL LOW (ref 38–126)
Anion gap: 9 (ref 5–15)
BUN: 11 mg/dL (ref 6–20)
CO2: 22 mmol/L (ref 22–32)
Calcium: 8.8 mg/dL — ABNORMAL LOW (ref 8.9–10.3)
Chloride: 107 mmol/L (ref 98–111)
Creatinine, Ser: 1.04 mg/dL (ref 0.61–1.24)
GFR, Estimated: >60 mL/min (ref >60)
Glucose, Bld: 138 mg/dL — ABNORMAL HIGH (ref 70–99)
Potassium: 4 mmol/L (ref 3.5–5.1)
Sodium: 138 mmol/L (ref 135–145)
Total Bilirubin: 0.6 mg/dL (ref 0.0–1.2)
Total Protein: 6.8 g/dL (ref 6.5–8.1)

## 2024-03-05 LAB — LIPASE, BLOOD: Lipase: 39 U/L (ref 11–51)

## 2024-03-05 MED ORDER — ALUM & MAG HYDROXIDE-SIMETH 200-200-20 MG/5ML PO SUSP
30.0000 mL | Freq: Once | ORAL | Status: AC
  Administered 2024-03-05: 30 mL via ORAL
  Filled 2024-03-05: qty 30

## 2024-03-05 MED ORDER — ONDANSETRON 4 MG PO TBDP
4.0000 mg | ORAL_TABLET | Freq: Three times a day (TID) | ORAL | 0 refills | Status: AC | PRN
  Filled 2024-03-05: qty 15, 5d supply, fill #0

## 2024-03-05 MED ORDER — SUCRALFATE 1 GM/10ML PO SUSP
1.0000 g | Freq: Three times a day (TID) | ORAL | 0 refills | Status: DC | PRN
  Filled 2024-03-05: qty 420, 14d supply, fill #0

## 2024-03-05 MED ORDER — LIDOCAINE VISCOUS HCL 2 % MT SOLN
15.0000 mL | Freq: Once | OROMUCOSAL | Status: AC
  Administered 2024-03-05: 15 mL via ORAL
  Filled 2024-03-05: qty 15

## 2024-03-05 NOTE — ED Notes (Signed)
 Please update mother, NEW number [phone]

## 2024-03-05 NOTE — ED Notes (Signed)
 Patient states he can't get a urine at this time.

## 2024-03-05 NOTE — Telephone Encounter (Signed)
**  After Hours/ Emergency Line Call**  Received a page to call [phone]) - [phone].  Patient: Maxwell Rose  Caller: Mother of patient  Confirmed name & DOB of patient with caller  Subjective:  Has been taking Ozempic  at 1 mg for 1-2 months. Tonight is throwing up bad, has had bad heart burn and diarrhea. Worse when he lays down and is hard for him to sleep. Is getting pale and shakey. Has been having symptoms for about a week. Has been doubling up on protonix  for past couple days. Notes he has been dizzy, and mom thinks he looks pale and shakey.     Objective:  Observations: actively vomiting,   Assessment & Plan  Maxwell Rose is a 28 y.o. male who calls with the following complaints and concerns, persistent vomiting and diarrhea, feeling light headed and dizzy. This is concerning for hypovolemia and I recommend patient be evaluated in ED to see if he is hypotensive. Patient may also need IV medicine to help control symptoms. Discussed having EMS come evaluate patient, as family does not have a vehicle, and EMS would determine if he needed to be taken to ED. Mother in agreement with plan.    Recommendations:  Call EMS for evaluation / go to ED for evaluation   -- Will forward to PCP.  Wilhemena Harbour, MD [facility], PGY-1

## 2024-03-05 NOTE — ED Provider Notes (Signed)
 [place] EM[facility] DEPARTMENT AT [facility] Provider Note  CSN: [id number] Arrival date & time: 03/05/24 4098  Chief Complaint(s) Emesis, Nausea, and Diarrhea  HPI Maxwell Rose is a 28 y.o. male with a past medical history listed below including obesity and type 2 diabetes recently placed on Ozempic  here for recurrent epigastric abdominal pain with nausea vomiting and diarrhea.  This has been ongoing since he started Ozempic  4 weeks ago.  Currently complaining of burning sensation in the left upper quadrant.  Reported eating home-cooked chicken and cheese sandwich.  No suspicious food intake.  No recent sick contacts.  No recent travel.  The history is provided by the patient.    Past Medical History Past Medical History:  Diagnosis Date   Anxiety    Bipolar 1 disorder (HCC)    Depression    GERD (gastroesophageal reflux disease)    Hearing difficulty    Hiatal hernia    Hiatal hernia    Hypertension    Obesity    Vision abnormalities    near sited   Patient Active Problem List   Diagnosis Date Noted   Otorrhea of right ear 03/26/2022   Type 2 diabetes mellitus with obesity (HCC) 05/25/2020   Fatigue 05/24/2020   Hearing loss associated with syndrome of both ears 01/19/2014   Morbid obesity with body mass index of 50 or higher (HCC) 06/29/2012   Prehypertension 12/06/2010   Sleep apnea 10/26/2010   Migraine variant 09/05/2010   Severe major depression without psychotic features (HCC) 07/30/2010   GERD 10/03/2009   NASH (nonalcoholic steatohepatitis) 05/31/2009   Home Medication(s) Prior to Admission medications   Medication Sig Start Date End Date Taking? Authorizing Provider  ondansetron  (ZOFRAN -ODT) 4 MG disintegrating tablet Take 1 tablet (4 mg total) by mouth every 8 (eight) hours as needed for up to 3 days for nausea or vomiting. 03/05/24 03/08/24 Yes [place] Zaragoza, Camila Cecil, MD  sucralfate (CARAFATE) 1 GM/10ML suspension Take 10 mLs (1 g total) by  mouth 3 (three) times daily as needed. 03/05/24  Yes Kolbie Clarkston, Camila Cecil, MD  fluticasone  (FLONASE ) 50 MCG/ACT nasal spray Place 1 spray into both nostrils in the morning and at bedtime. Patient not taking: Reported on 12/31/2022    [provider]  hydrOXYzine  (ATARAX ) 25 MG tablet Take 1 tablet (25 mg total) by mouth at bedtime as needed for anxiety. 02/26/24   Rumball, Alison M, DO  metFORMIN  (GLUCOPHAGE -XR) 500 MG 24 hr tablet Take 1 tablet (500 mg total) by mouth 2 (two) times daily with a meal. 03/03/24   Rumball, Jarome Merritt, DO  metoprolol  succinate (TOPROL -XL) 50 MG 24 hr tablet Take 1 tablet (50 mg total) by mouth at bedtime. 02/26/24   Kandis Ormond, DO  pantoprazole  (PROTONIX ) 40 MG tablet Take 1 tablet (40 mg total) by mouth daily. 11/29/23   Rumball, Alison M, DO  Semaglutide ,0.25 or 0.5MG /DOS, 2 MG/3ML SOPN Inject 0.5 mg into the skin once a week. 02/26/24   Rumball, Alison M, DO  Allergies Penicillins  Review of Systems Review of Systems As noted in HPI  Physical Exam Vital Signs  I have reviewed the triage vital signs BP (!) 143/98   Pulse 98   Temp 98.8 F (37.1 C)   Resp 20   Ht 5\' 7"  (1.702 m)   Wt (!) 212.3 kg   SpO2 99%   BMI 73.30 kg/m   Physical Exam Vitals reviewed.  Constitutional:      General: He is not in acute distress.    Appearance: He is well-developed. He is obese. He is not diaphoretic.  HENT:     Head: Normocephalic and atraumatic.     Right Ear: External ear normal.     Left Ear: External ear normal.     Nose: Nose normal.     Mouth/Throat:     Mouth: Mucous membranes are moist.  Eyes:     General: No scleral icterus.    Conjunctiva/sclera: Conjunctivae normal.  Neck:     Trachea: Phonation normal.  Cardiovascular:     Rate and Rhythm: Normal rate and regular rhythm.  Pulmonary:     Effort: Pulmonary  effort is normal. No respiratory distress.     Breath sounds: No stridor.  Abdominal:     General: There is no distension.     Tenderness: There is abdominal tenderness in the left upper quadrant. There is no guarding or rebound.  Musculoskeletal:        General: Normal range of motion.     Cervical back: Normal range of motion.  Neurological:     Mental Status: He is alert and oriented to person, place, and time.  Psychiatric:        Behavior: Behavior normal.     ED Results and Treatments Labs (all labs ordered are listed, but only abnormal results are displayed) Labs Reviewed  COMPREHENSIVE METABOLIC PANEL WITH GFR - Abnormal; Notable for the following components:      Result Value   Glucose, Bld 138 (*)    Calcium  8.8 (*)    Albumin 3.1 (*)    Alkaline Phosphatase 37 (*)    All other components within normal limits  CBG MONITORING, ED - Abnormal; Notable for the following components:   Glucose-Capillary 129 (*)    All other components within normal limits  CBC WITH DIFFERENTIAL/PLATELET  LIPASE, BLOOD  URINALYSIS, ROUTINE W REFLEX MICROSCOPIC                                                                                                                         EKG  EKG Interpretation Date/Time:    Ventricular Rate:    PR Interval:    QRS Duration:    QT Interval:    QTC Calculation:   R Axis:      Text Interpretation:         Radiology No results found.  Medications Ordered in ED Medications  alum & mag hydroxide-simeth (MAALOX/MYLANTA) 200-200-20 MG/5ML suspension 30 mL (  30 mLs Oral Given 03/05/24 0600)    And  lidocaine  (XYLOCAINE ) 2 % viscous mouth solution 15 mL (15 mLs Oral Given 03/05/24 0600)   Procedures Procedures  (including critical care time) Medical Decision Making / ED Course   Medical Decision Making Amount and/or Complexity of Data Reviewed Labs: ordered. Decision-making details documented in ED Course.  Risk OTC  drugs. Prescription drug management.    Upper abdominal discomfort with mild tenderness to palpation in left upper quadrant. Differential diagnosis considered.  Workup below. Likely side effect from Ozempic  versus exacerbation of gastritis. Labs do not reveal evidence of biliary obstruction or pancreatitis.  CBC without leukocytosis and exam not concerning for serious intra-abdominal inflammatory/infectious process or bowel obstruction.  Patient was given GI cocktail which provided significant relief of symptoms and he was able to tolerate it without recurrent emesis.  No need for imaging at this time.    Final Clinical Impression(s) / ED Diagnoses Final diagnoses:  Epigastric pain  Nausea and vomiting in adult   The patient appears reasonably screened and/or stabilized for discharge and I doubt any other medical condition or other [facility] requiring further screening, evaluation, or treatment in the ED at this time. I have discussed the findings, Dx and Tx plan with the patient/family who expressed understanding and agree(s) with the plan. Discharge instructions discussed at length. The patient/family was given strict return precautions who verbalized understanding of the instructions. No further questions at time of discharge.  Disposition: Discharge  Condition: Good  ED Discharge Orders          Ordered    sucralfate (CARAFATE) 1 GM/10ML suspension  3 times daily PRN        03/05/24 0632    ondansetron  (ZOFRAN -ODT) 4 MG disintegrating tablet  Every 8 hours PRN        03/05/24 2130              Follow Up: Rumball, Alison M, DO [address] [place] [address] [phone]  Call  to schedule an appointment for close follow up    This chart was dictated using voice recognition software.  Despite best efforts to proofread,  errors can occur which can change the documentation meaning.    Lindle Rhea, MD 03/05/24 [phone]

## 2024-03-05 NOTE — ED Provider Triage Note (Addendum)
 Emergency Medicine Provider Triage Evaluation Note  Maxwell Rose , a 28 y.o. male  was evaluated in triage.  Pt complains of N/V/D x 1 week. Has been ozempic  for the past month or so, having trouble after eating/drinking for the past week-- states he either vomits or has diarrhea right after.  Has not increased his dose.  No other new medications.  Review of Systems  Positive: N/V/D Negative: fever  Physical Exam  BP (!) 133/99 (BP Location: Left Wrist)   Pulse (!) 101   Temp 98.9 F (37.2 C) (Oral)   Resp (!) 24   Ht 5\' 7"  (1.702 m)   Wt (!) 212.3 kg   SpO2 100%   BMI 73.30 kg/m  Gen:   Awake, no distress   Resp:  Normal effort  MSK:   Moves extremities without difficulty  Other:  Morbidly obese, abdomen is overall non-tender  Medical Decision Making  Medically screening exam initiated at 4:32 AM.  Appropriate orders placed.  Aslan L Benham was informed that the remainder of the evaluation will be completed by another provider, this initial triage assessment does not replace that evaluation, and the importance of remaining in the ED until their evaluation is complete.  N/V/D x 1 week.  Has been on ozempic  for the past few months.  No changes in dosage.  Labs sent.   Coretha Dew, PA-C 03/05/24 0435    Coretha Dew, PA-C 03/05/24 [phone]

## 2024-03-05 NOTE — ED Triage Notes (Signed)
 Patient presents to ed via [facility] from home c/o n/v/d x 1 week, with abd. Pain denies blood in emesis or stool. Patient was given Zofran  4 mg IM per ems

## 2024-04-08 ENCOUNTER — Other Ambulatory Visit: Payer: Self-pay | Admitting: Family Medicine

## 2024-04-08 ENCOUNTER — Other Ambulatory Visit: Payer: Self-pay

## 2024-04-08 ENCOUNTER — Other Ambulatory Visit (HOSPITAL_COMMUNITY): Payer: Self-pay

## 2024-04-08 ENCOUNTER — Other Ambulatory Visit (HOSPITAL_BASED_OUTPATIENT_CLINIC_OR_DEPARTMENT_OTHER): Payer: Self-pay

## 2024-04-08 MED ORDER — SUCRALFATE 1 GM/10ML PO SUSP
1.0000 g | Freq: Three times a day (TID) | ORAL | 0 refills | Status: DC | PRN
  Filled 2024-04-08: qty 420, 14d supply, fill #0

## 2024-05-21 ENCOUNTER — Other Ambulatory Visit: Payer: Self-pay

## 2024-05-21 ENCOUNTER — Other Ambulatory Visit (HOSPITAL_COMMUNITY): Payer: Self-pay

## 2024-05-21 ENCOUNTER — Other Ambulatory Visit (HOSPITAL_BASED_OUTPATIENT_CLINIC_OR_DEPARTMENT_OTHER): Payer: Self-pay

## 2024-05-21 ENCOUNTER — Other Ambulatory Visit: Payer: Self-pay | Admitting: Family Medicine

## 2024-05-21 MED ORDER — SUCRALFATE 1 GM/10ML PO SUSP
1.0000 g | Freq: Three times a day (TID) | ORAL | 0 refills | Status: DC | PRN
  Filled 2024-05-21: qty 420, 14d supply, fill #0

## 2024-05-24 ENCOUNTER — Other Ambulatory Visit (HOSPITAL_COMMUNITY): Payer: Self-pay

## 2024-06-03 ENCOUNTER — Other Ambulatory Visit: Payer: Self-pay | Admitting: Family Medicine

## 2024-06-03 ENCOUNTER — Other Ambulatory Visit (HOSPITAL_COMMUNITY): Payer: Self-pay

## 2024-06-03 MED ORDER — METOPROLOL SUCCINATE ER 50 MG PO TB24
50.0000 mg | ORAL_TABLET | Freq: Every day | ORAL | 0 refills | Status: DC
  Filled 2024-06-03: qty 90, 90d supply, fill #0

## 2024-06-11 ENCOUNTER — Ambulatory Visit: Admitting: Family Medicine

## 2024-06-11 NOTE — Progress Notes (Deleted)
    SUBJECTIVE:   CHIEF COMPLAINT / HPI:   Diabetes, Type 2 - Last A1c 6.1 02/2024 - Medications: metformin , ozempic  - Compliance: *** - Checking BG at home: no - Exercise: walking about 1 mile per day sometimes*** - Eye exam: due - Foot exam: UTD - Microalbumin: UTD - Some nausea, vomiting, diarrhea.  Is intermittent and attributes to ongoing GI issues.  Unsure if Ozempic  is contributing.***   Depression - Medications: atarax  prn. previously on prozac , effexor , abilify . - h/o self-cutting, SI, HI. - Taking: n/a - Counseling: no*** - Previous hospitalizations: no. Prior ED visits for SI/HI, evaluated for admission but after rooming in ED for 2 days, discharged on effexor  by telepsych. - FH of psych illness: mom with bipolar - Symptoms: feeling down, avoidance of people*** - Current stressors: current health, mom's current health*** - Coping Mechanisms: sleeping   Migraines  - increased metoprolol  last visit.  - Doesn't take anything for abortive therapy. - ***  ***consider abilify   OBJECTIVE:   There were no vitals taken for this visit.  ***  ASSESSMENT/PLAN:   No problem-specific Assessment & Plan notes found for this encounter.     Donald CHRISTELLA Lai, DO

## 2024-08-30 ENCOUNTER — Other Ambulatory Visit (HOSPITAL_COMMUNITY): Payer: Self-pay

## 2024-08-30 ENCOUNTER — Other Ambulatory Visit: Payer: Self-pay | Admitting: Family Medicine

## 2024-08-30 ENCOUNTER — Other Ambulatory Visit: Payer: Self-pay

## 2024-08-30 MED ORDER — SUCRALFATE 1 GM/10ML PO SUSP
1.0000 g | Freq: Three times a day (TID) | ORAL | 0 refills | Status: DC | PRN
  Filled 2024-08-30: qty 473, 16d supply, fill #0

## 2024-08-30 MED ORDER — OZEMPIC (0.25 OR 0.5 MG/DOSE) 2 MG/3ML ~~LOC~~ SOPN
0.5000 mg | PEN_INJECTOR | SUBCUTANEOUS | 0 refills | Status: DC
  Filled 2024-08-30: qty 3, 28d supply, fill #0

## 2024-08-30 MED ORDER — METOPROLOL SUCCINATE ER 50 MG PO TB24
50.0000 mg | ORAL_TABLET | Freq: Every day | ORAL | 0 refills | Status: DC
  Filled 2024-08-30: qty 30, 30d supply, fill #0

## 2024-10-07 ENCOUNTER — Other Ambulatory Visit (HOSPITAL_COMMUNITY): Payer: Self-pay

## 2024-10-07 ENCOUNTER — Other Ambulatory Visit: Payer: Self-pay

## 2024-10-07 ENCOUNTER — Other Ambulatory Visit: Payer: Self-pay | Admitting: Family Medicine

## 2024-10-07 MED ORDER — METOPROLOL SUCCINATE ER 50 MG PO TB24
50.0000 mg | ORAL_TABLET | Freq: Every day | ORAL | 0 refills | Status: DC
  Filled 2024-10-07: qty 30, 30d supply, fill #0

## 2024-11-01 ENCOUNTER — Ambulatory Visit: Admitting: Family Medicine

## 2024-11-11 ENCOUNTER — Other Ambulatory Visit: Payer: Self-pay | Admitting: Family Medicine

## 2024-11-11 ENCOUNTER — Other Ambulatory Visit (HOSPITAL_COMMUNITY): Payer: Self-pay

## 2024-11-11 DIAGNOSIS — K219 Gastro-esophageal reflux disease without esophagitis: Secondary | ICD-10-CM

## 2024-11-17 ENCOUNTER — Other Ambulatory Visit (HOSPITAL_COMMUNITY): Payer: Self-pay

## 2024-11-17 ENCOUNTER — Other Ambulatory Visit: Payer: Self-pay | Admitting: Family Medicine

## 2024-11-17 ENCOUNTER — Other Ambulatory Visit: Payer: Self-pay

## 2024-11-17 DIAGNOSIS — K219 Gastro-esophageal reflux disease without esophagitis: Secondary | ICD-10-CM

## 2024-11-17 MED ORDER — OZEMPIC (0.25 OR 0.5 MG/DOSE) 2 MG/3ML ~~LOC~~ SOPN
0.5000 mg | PEN_INJECTOR | SUBCUTANEOUS | 0 refills | Status: AC
  Filled 2024-11-17: qty 3, 28d supply, fill #0

## 2024-11-17 MED ORDER — METOPROLOL SUCCINATE ER 50 MG PO TB24
50.0000 mg | ORAL_TABLET | Freq: Every day | ORAL | 0 refills | Status: AC
  Filled 2024-11-17: qty 30, 30d supply, fill #0

## 2024-11-17 MED ORDER — PANTOPRAZOLE SODIUM 40 MG PO TBEC
40.0000 mg | DELAYED_RELEASE_TABLET | Freq: Every day | ORAL | 0 refills | Status: AC
  Filled 2024-11-17: qty 30, 30d supply, fill #0

## 2024-11-17 MED ORDER — HYDROXYZINE HCL 25 MG PO TABS
25.0000 mg | ORAL_TABLET | Freq: Every evening | ORAL | 0 refills | Status: AC | PRN
  Filled 2024-11-17: qty 30, 30d supply, fill #0

## 2024-11-17 MED ORDER — SUCRALFATE 1 GM/10ML PO SUSP
1.0000 g | Freq: Three times a day (TID) | ORAL | 0 refills | Status: AC | PRN
  Filled 2024-11-17: qty 414, 14d supply, fill #0

## 2024-11-18 ENCOUNTER — Other Ambulatory Visit: Payer: Self-pay

## 2024-11-25 NOTE — Progress Notes (Unsigned)
" ° °  SUBJECTIVE:   CHIEF COMPLAINT / HPI:   Discussed the use of AI scribe software for clinical note transcription with the patient, who gave verbal consent to proceed.  History of Present Illness     Start ARB for DM2 if able.  OBJECTIVE:   There were no vitals taken for this visit.  Gen: well appearing, in NAD Card: RRR Lungs: CTAB Ext: WWP, no edema ***  ASSESSMENT/PLAN:   No problem-specific Assessment & Plan notes found for this encounter.     Assessment and Plan Assessment & Plan         Donald CHRISTELLA Lai, DO "

## 2024-11-26 ENCOUNTER — Ambulatory Visit: Admitting: Family Medicine

## 2024-11-26 DIAGNOSIS — E119 Type 2 diabetes mellitus without complications: Secondary | ICD-10-CM

## 2024-12-01 ENCOUNTER — Ambulatory Visit: Admitting: Student
